# Patient Record
Sex: Female | Born: 1950 | Race: White | Hispanic: No | Marital: Married | Smoking: Never smoker
Health system: Southern US, Community
[De-identification: ages and names within clinical notes are randomized; demographics above are authoritative.]

## PROBLEM LIST (undated history)

## (undated) DIAGNOSIS — E119 Type 2 diabetes mellitus without complications: Secondary | ICD-10-CM

## (undated) DIAGNOSIS — G4730 Sleep apnea, unspecified: Secondary | ICD-10-CM

## (undated) DIAGNOSIS — Z8489 Family history of other specified conditions: Secondary | ICD-10-CM

## (undated) DIAGNOSIS — Z8709 Personal history of other diseases of the respiratory system: Secondary | ICD-10-CM

## (undated) DIAGNOSIS — M1990 Unspecified osteoarthritis, unspecified site: Secondary | ICD-10-CM

## (undated) DIAGNOSIS — I10 Essential (primary) hypertension: Secondary | ICD-10-CM

## (undated) DIAGNOSIS — Z8639 Personal history of other endocrine, nutritional and metabolic disease: Secondary | ICD-10-CM

## (undated) DIAGNOSIS — F410 Panic disorder [episodic paroxysmal anxiety] without agoraphobia: Secondary | ICD-10-CM

## (undated) DIAGNOSIS — E7800 Pure hypercholesterolemia, unspecified: Secondary | ICD-10-CM

## (undated) HISTORY — PX: CHOLECYSTECTOMY: SHX55

## (undated) HISTORY — PX: EYE SURGERY: SHX253

## (undated) HISTORY — PX: TUBAL LIGATION: SHX77

---

## 2018-01-25 ENCOUNTER — Encounter (INDEPENDENT_AMBULATORY_CARE_PROVIDER_SITE_OTHER): Payer: Self-pay | Admitting: Orthopaedic Surgery

## 2018-01-25 ENCOUNTER — Ambulatory Visit (INDEPENDENT_AMBULATORY_CARE_PROVIDER_SITE_OTHER): Payer: Medicare Other | Admitting: Orthopaedic Surgery

## 2018-01-25 VITALS — Ht 62.0 in | Wt 260.0 lb

## 2018-01-25 DIAGNOSIS — M1612 Unilateral primary osteoarthritis, left hip: Secondary | ICD-10-CM

## 2018-01-25 DIAGNOSIS — M1611 Unilateral primary osteoarthritis, right hip: Secondary | ICD-10-CM | POA: Diagnosis not present

## 2018-01-25 NOTE — Progress Notes (Signed)
Office Visit Note   Patient: Brittany Nielsen           Date of Birth: July 28, 1950           MRN: [id number] Visit Date: 01/25/2018              Requested by: No referring provider defined for this encounter. PCP: System, Pcp Not In   Assessment & Plan: Visit Diagnoses:  1. Unilateral primary osteoarthritis, left hip   2. Unilateral primary osteoarthritis, right hip     Plan: Given her anatomy and the severity of her arthritis, I am comfortable with proceeding with hip replacement surgery on her right hip.  She understands that she has a significant height and risk for wound complications and implant failure given her obesity.  There is a high risk of infection as well.  Given that, other than her BMI of being over 40, the remainder of her clinical exam is entirely normal in terms of getting to her right hip safely through a direct anterior approach.  I was actually quite surprised at this.  I do feel that if the hip is been successfully on her she could then continue to lose weight.  We had a long and thorough discussion about the surgery.  I showed her x-rays and we will look that a hip model and explained in detail what the surgery involves.  We had a discussion about the intraoperative and postoperative course and the risk and benefits involved.  All question concerns were answered and addressed.  We will work on getting her right hip scheduled for a total hip arthroplasty.  Follow-Up Instructions: Return for 2 weeks post-op.   Orders:  No orders of the defined types were placed in this encounter.  No orders of the defined types were placed in this encounter.     Procedures: No procedures performed   Clinical Data: No additional findings.   Subjective: Chief Complaint  Patient presents with  . Right Hip - Pain  . Left Hip - Pain  The patient is a very pleasant 68 year old female who comes in for evaluation treatment of bilateral hip pain with the right worse than  the left.  Is been going on for now over 3 years.  She is someone with a BMI of 47.  She is not a diabetic.  She does use a walking stick when she ambulates.  Her pain is become 10 out of 10.  It is gotten to where it is detrimentally affected her mobility, her quality of life and her activities of daily living.  She has x-rays that accompany her.  She has been told she has severe end-stage arthritis of both her hips.  She is also been told that given her BMI she needs to lose a significant of weight before anyone would attempt surgery.  However she has not had any hands on exam with someone actually examining her right hip and examining her in terms of her obesity.  She is worked as much she can on activity modification and trying to lose weight.  HPI  Review of Systems She currently denies any headache, chest pain, shortness of breath, fever, chills, nausea, vomiting  Objective: Vital Signs: Ht 5\' 2"  (1.575 m)   Wt 260 lb (117.9 kg)   BMI 47.55 kg/m   Physical Exam She is alert and oriented x3 and in no acute distress Ortho Exam On examination she is able to get up on the exam table and I  later in a supine position.  Both hips flex and extend well.  Both hips actually have good internal and external rotation but severe pain in the groin on both hips with the right worse than the left.  Her leg lengths are equal.  Given her obesity she actually has a very small thighs and small legs.  Her abdomen is easily able to be mobilized and I can get it out of the way easily to consider anterior preplacement surgery. Specialty Comments:  No specialty comments available.  Imaging: No results found.   PMFS History: Patient Active Problem List   Diagnosis Date Noted  . Unilateral primary osteoarthritis, left hip 01/25/2018  . Unilateral primary osteoarthritis, right hip 01/25/2018   History reviewed. No pertinent past medical history.  History reviewed. No pertinent family history.  History  reviewed. No pertinent surgical history. Social History   Occupational History  . Not on file  Tobacco Use  . Smoking status: Not on file  Substance and Sexual Activity  . Alcohol use: Not on file  . Drug use: Not on file  . Sexual activity: Not on file

## 2018-02-12 ENCOUNTER — Other Ambulatory Visit (INDEPENDENT_AMBULATORY_CARE_PROVIDER_SITE_OTHER): Payer: Self-pay | Admitting: Physician Assistant

## 2018-02-15 NOTE — Patient Instructions (Addendum)
Brittany Nielsen  02/15/2018   Your procedure is scheduled on: 02-26-2018   Report to [facility] Main  Entrance     Report to admitting at 7:15AM    Call this number if you have problems the morning of surgery [phone]   PLEASE BRING CPAP MASK AND  TUBING ONLY. DEVICE WILL BE PROVIDED!    Remember: Do not eat food or drink liquids :After Midnight. BRUSH YOUR TEETH MORNING OF SURGERY AND RINSE YOUR MOUTH OUT, NO CHEWING GUM CANDY OR MINTS.     Take these medicines the morning of surgery with A SIP OF WATER: NONE                                You may not have any metal on your body including hair pins and              piercings  Do not wear jewelry, make-up, lotions, powders or perfumes, deodorant             Do not wear nail polish.  Do not shave  48 hours prior to surgery.                Do not bring valuables to the [facility]. [facility] IS NOT             RESPONSIBLE   FOR VALUABLES.  Contacts, dentures or bridgework may not be worn into surgery.  Leave suitcase in the car. After surgery it may be brought to your room.                   Please read over the following fact sheets you were given: _____________________________________________________________________             [facility] Pre[facility] Before surgery, you can play an important role.  Because skin is not sterile, your skin needs to be as free of germs as possible.  You can reduce the number of germs on your skin by washing with CHG (chlorahexidine gluconate) soap before surgery.  CHG is an antiseptic cleaner which kills germs and bonds with the skin to continue killing germs even after washing. Please DO NOT use if you have an allergy to CHG or antibacterial soaps.  If your skin becomes reddened/irritated stop using the CHG and inform your nurse when you arrive at Short Stay. Do not shave (including legs and underarms) for at least 48 hours prior to the first CHG  shower.  You may shave your face/neck. Please follow these instructions carefully:  1.  Shower with CHG Soap the night before surgery and the  morning of Surgery.  2.  If you choose to wash your hair, wash your hair first as usual with your  normal  shampoo.  3.  After you shampoo, rinse your hair and body thoroughly to remove the  shampoo.                           4.  Use CHG as you would any other liquid soap.  You can apply chg directly  to the skin and wash                       Gently with a scrungie or clean washcloth.  5.  Apply the CHG Soap to your body ONLY FROM THE NECK DOWN.   Do not use on face/ open                           Wound or open sores. Avoid contact with eyes, ears mouth and genitals (private parts).                       Wash face,  Genitals (private parts) with your normal soap.             6.  Wash thoroughly, paying special attention to the area where your surgery  will be performed.  7.  Thoroughly rinse your body with warm water from the neck down.  8.  DO NOT shower/wash with your normal soap after using and rinsing off  the CHG Soap.                9.  Pat yourself dry with a clean towel.            10.  Wear clean pajamas.            11.  Place clean sheets on your bed the night of your first shower and do not  sleep with pets. Day of Surgery : Do not apply any lotions/deodorants the morning of surgery.  Please wear clean clothes to the [facility]/surgery center.  FAILURE TO FOLLOW THESE INSTRUCTIONS MAY RESULT IN THE CANCELLATION OF YOUR SURGERY PATIENT SIGNATURE_________________________________  NURSE SIGNATURE__________________________________  ________________________________________________________________________   [name]  An incentive spirometer is a tool that can help keep your lungs clear and active. This tool measures how well you are filling your lungs with each breath. Taking long deep breaths may help reverse or decrease the chance  of developing breathing (pulmonary) problems (especially infection) following:  A long period of time when you are unable to move or be active. BEFORE THE PROCEDURE   If the spirometer includes an indicator to show your best effort, your nurse or respiratory therapist will set it to a desired goal.  If possible, sit up straight or lean slightly forward. Try not to slouch.  Hold the incentive spirometer in an upright position. INSTRUCTIONS FOR USE  1. Sit on the edge of your bed if possible, or sit up as far as you can in bed or on a chair. 2. Hold the incentive spirometer in an upright position. 3. Breathe out normally. 4. Place the mouthpiece in your mouth and seal your lips tightly around it. 5. Breathe in slowly and as deeply as possible, raising the piston or the ball toward the top of the column. 6. Hold your breath for 3-5 seconds or for as long as possible. Allow the piston or ball to fall to the bottom of the column. 7. Remove the mouthpiece from your mouth and breathe out normally. 8. Rest for a few seconds and repeat Steps 1 through 7 at least 10 times every 1-2 hours when you are awake. Take your time and take a few normal breaths between deep breaths. 9. The spirometer may include an indicator to show your best effort. Use the indicator as a goal to work toward during each repetition. 10. After each set of 10 deep breaths, practice coughing to be sure your lungs are clear. If you have an incision (the cut made at the time of surgery), support your incision when coughing by placing a pillow or  rolled up towels firmly against it. Once you are able to get out of bed, walk around indoors and cough well. You may stop using the incentive spirometer when instructed by your caregiver.  RISKS AND COMPLICATIONS  Take your time so you do not get dizzy or light-headed.  If you are in pain, you may need to take or ask for pain medication before doing incentive spirometry. It is harder to  take a deep breath if you are having pain. AFTER USE  Rest and breathe slowly and easily.  It can be helpful to keep track of a log of your progress. Your caregiver can provide you with a simple table to help with this. If you are using the spirometer at home, follow these instructions: [place] IF:   You are having difficultly using the spirometer.  You have trouble using the spirometer as often as instructed.  Your pain medication is not giving enough relief while using the spirometer.  You develop fever of 100.5 F (38.1 C) or higher. SEEK IMMEDIATE MEDICAL CARE IF:   You cough up bloody sputum that had not been present before.  You develop fever of 102 F (38.9 C) or greater.  You develop worsening pain at or near the incision site. MAKE SURE YOU:   Understand these instructions.  Will watch your condition.  Will get help right away if you are not doing well or get worse. Document Released: 05/12/2006 Document Revised: 03/24/2011 Document Reviewed: 07/13/2006 [facility] Patient Information 2014 [place], [place].   ________________________________________________________________________

## 2018-02-16 ENCOUNTER — Encounter (HOSPITAL_COMMUNITY)
Admission: RE | Admit: 2018-02-16 | Discharge: 2018-02-16 | Disposition: A | Discharge status: Home / Self Care | Payer: Medicare Other | Source: Ambulatory Visit | Attending: Orthopaedic Surgery | Admitting: Orthopaedic Surgery

## 2018-02-16 ENCOUNTER — Other Ambulatory Visit: Payer: Self-pay

## 2018-02-16 ENCOUNTER — Encounter (HOSPITAL_COMMUNITY): Payer: Self-pay | Admitting: Emergency Medicine

## 2018-02-16 DIAGNOSIS — Z01818 Encounter for other preprocedural examination: Secondary | ICD-10-CM | POA: Insufficient documentation

## 2018-02-16 DIAGNOSIS — R000 Tachycardia, unspecified: Secondary | ICD-10-CM | POA: Insufficient documentation

## 2018-02-16 DIAGNOSIS — M1611 Unilateral primary osteoarthritis, right hip: Secondary | ICD-10-CM | POA: Diagnosis not present

## 2018-02-16 DIAGNOSIS — R9431 Abnormal electrocardiogram [ECG] [EKG]: Secondary | ICD-10-CM | POA: Insufficient documentation

## 2018-02-16 DIAGNOSIS — I10 Essential (primary) hypertension: Secondary | ICD-10-CM | POA: Diagnosis not present

## 2018-02-16 HISTORY — DX: Personal history of other endocrine, nutritional and metabolic disease: Z86.39

## 2018-02-16 HISTORY — DX: Essential (primary) hypertension: I10

## 2018-02-16 HISTORY — DX: Personal history of other diseases of the respiratory system: Z87.09

## 2018-02-16 HISTORY — DX: Sleep apnea, unspecified: G47.30

## 2018-02-16 HISTORY — DX: Pure hypercholesterolemia, unspecified: E78.00

## 2018-02-16 HISTORY — DX: Panic disorder (episodic paroxysmal anxiety): F41.0

## 2018-02-16 HISTORY — DX: Unspecified osteoarthritis, unspecified site: M19.90

## 2018-02-16 HISTORY — DX: Family history of other specified conditions: Z84.89

## 2018-02-16 LAB — CBC
HCT: 46.5 % — ABNORMAL HIGH (ref 36.0–46.0)
HEMOGLOBIN: 14.8 g/dL (ref 12.0–15.0)
MCH: 27.5 pg (ref 26.0–34.0)
MCHC: 31.8 g/dL (ref 30.0–36.0)
MCV: 86.3 fL (ref 80.0–100.0)
Platelets: 339 10*3/uL (ref 150–400)
RBC: 5.39 MIL/uL — ABNORMAL HIGH (ref 3.87–5.11)
RDW: 13.4 % (ref 11.5–15.5)
WBC: 13.5 10*3/uL — ABNORMAL HIGH (ref 4.0–10.5)
nRBC: 0 % (ref 0.0–0.2)

## 2018-02-16 LAB — BASIC METABOLIC PANEL
Anion gap: 11 (ref 5–15)
BUN: 18 mg/dL (ref 8–23)
CO2: 25 mmol/L (ref 22–32)
Calcium: 9.2 mg/dL (ref 8.9–10.3)
Chloride: 100 mmol/L (ref 98–111)
Creatinine, Ser: 1.01 mg/dL — ABNORMAL HIGH (ref 0.44–1.00)
GFR calc Af Amer: >60 mL/min (ref >60)
GFR, EST NON AFRICAN AMERICAN: 58 mL/min — AB (ref >60)
Glucose, Bld: 133 mg/dL — ABNORMAL HIGH (ref 70–99)
POTASSIUM: 3.5 mmol/L (ref 3.5–5.1)
Sodium: 136 mmol/L (ref 135–145)

## 2018-02-16 LAB — SURGICAL PCR SCREEN
MRSA, PCR: NEGATIVE
Staphylococcus aureus: POSITIVE — AB

## 2018-02-26 ENCOUNTER — Ambulatory Visit (HOSPITAL_COMMUNITY): Payer: Medicare Other

## 2018-02-26 ENCOUNTER — Encounter (HOSPITAL_COMMUNITY)
Admission: RE | Disposition: A | Discharge status: Home Health | Payer: Self-pay | Source: Home / Self Care | Attending: Orthopaedic Surgery

## 2018-02-26 ENCOUNTER — Encounter (HOSPITAL_COMMUNITY): Payer: Self-pay

## 2018-02-26 ENCOUNTER — Ambulatory Visit (HOSPITAL_COMMUNITY): Payer: Medicare Other | Admitting: Registered Nurse

## 2018-02-26 ENCOUNTER — Observation Stay (HOSPITAL_COMMUNITY): Payer: Medicare Other

## 2018-02-26 ENCOUNTER — Inpatient Hospital Stay (HOSPITAL_COMMUNITY)
Admission: RE | Admit: 2018-02-26 | Discharge: 2018-02-28 | DRG: 470 | Disposition: A | Discharge status: Home Health | Payer: Medicare Other | Attending: Orthopaedic Surgery | Admitting: Orthopaedic Surgery

## 2018-02-26 ENCOUNTER — Ambulatory Visit (HOSPITAL_COMMUNITY): Payer: Medicare Other | Admitting: Physician Assistant

## 2018-02-26 ENCOUNTER — Other Ambulatory Visit: Payer: Self-pay

## 2018-02-26 DIAGNOSIS — M1611 Unilateral primary osteoarthritis, right hip: Secondary | ICD-10-CM | POA: Diagnosis not present

## 2018-02-26 DIAGNOSIS — Z6841 Body Mass Index (BMI) 40.0 and over, adult: Secondary | ICD-10-CM

## 2018-02-26 DIAGNOSIS — M1612 Unilateral primary osteoarthritis, left hip: Secondary | ICD-10-CM | POA: Diagnosis present

## 2018-02-26 DIAGNOSIS — G4730 Sleep apnea, unspecified: Secondary | ICD-10-CM | POA: Diagnosis present

## 2018-02-26 DIAGNOSIS — E7800 Pure hypercholesterolemia, unspecified: Secondary | ICD-10-CM | POA: Diagnosis present

## 2018-02-26 DIAGNOSIS — Z419 Encounter for procedure for purposes other than remedying health state, unspecified: Secondary | ICD-10-CM

## 2018-02-26 DIAGNOSIS — I10 Essential (primary) hypertension: Secondary | ICD-10-CM | POA: Diagnosis present

## 2018-02-26 DIAGNOSIS — Z96641 Presence of right artificial hip joint: Secondary | ICD-10-CM

## 2018-02-26 HISTORY — PX: TOTAL HIP ARTHROPLASTY: SHX124

## 2018-02-26 HISTORY — PX: JOINT REPLACEMENT: SHX530

## 2018-02-26 SURGERY — ARTHROPLASTY, HIP, TOTAL, ANTERIOR APPROACH
Anesthesia: Spinal | Site: Hip | Laterality: Right

## 2018-02-26 MED ORDER — MENTHOL 3 MG MT LOZG: 1.0000 | LOZENGE | OROMUCOSAL | Status: DC | PRN

## 2018-02-26 MED ORDER — DOCUSATE SODIUM 100 MG PO CAPS
100.0000 mg | ORAL_CAPSULE | Freq: Two times a day (BID) | ORAL | Status: DC
  Administered 2018-02-26 – 2018-02-28 (×4): 100 mg via ORAL
  Filled 2018-02-26 (×4): qty 1

## 2018-02-26 MED ORDER — ALUM & MAG HYDROXIDE-SIMETH 200-200-20 MG/5ML PO SUSP: 30.0000 mL | ORAL | Status: DC | PRN

## 2018-02-26 MED ORDER — PHENYLEPHRINE 40 MCG/ML (10ML) SYRINGE FOR IV PUSH (FOR BLOOD PRESSURE SUPPORT)
PREFILLED_SYRINGE | INTRAVENOUS | Status: AC
  Filled 2018-02-26: qty 10

## 2018-02-26 MED ORDER — ONDANSETRON HCL 4 MG/2ML IJ SOLN
4.0000 mg | Freq: Four times a day (QID) | INTRAMUSCULAR | Status: DC | PRN
  Administered 2018-02-26 – 2018-02-27 (×2): 4 mg via INTRAVENOUS
  Filled 2018-02-26 (×2): qty 2

## 2018-02-26 MED ORDER — LIDOCAINE 2% (20 MG/ML) 5 ML SYRINGE
INTRAMUSCULAR | Status: AC
  Filled 2018-02-26: qty 5

## 2018-02-26 MED ORDER — SODIUM CHLORIDE 0.9 % IV SOLN
INTRAVENOUS | Status: DC | PRN
  Administered 2018-02-26: 40 ug/min via INTRAVENOUS

## 2018-02-26 MED ORDER — PHENYLEPHRINE 40 MCG/ML (10ML) SYRINGE FOR IV PUSH (FOR BLOOD PRESSURE SUPPORT)
PREFILLED_SYRINGE | INTRAVENOUS | Status: DC | PRN
  Administered 2018-02-26 (×2): 80 ug via INTRAVENOUS

## 2018-02-26 MED ORDER — MIDAZOLAM HCL 5 MG/5ML IJ SOLN
INTRAMUSCULAR | Status: DC | PRN
  Administered 2018-02-26 (×2): 1 mg via INTRAVENOUS

## 2018-02-26 MED ORDER — DEXAMETHASONE SODIUM PHOSPHATE 10 MG/ML IJ SOLN
INTRAMUSCULAR | Status: DC | PRN
  Administered 2018-02-26: 10 mg via INTRAVENOUS

## 2018-02-26 MED ORDER — METHOCARBAMOL 500 MG PO TABS
500.0000 mg | ORAL_TABLET | Freq: Four times a day (QID) | ORAL | Status: DC | PRN
  Administered 2018-02-27: 500 mg via ORAL
  Filled 2018-02-26: qty 1

## 2018-02-26 MED ORDER — HYDROMORPHONE HCL 1 MG/ML IJ SOLN
0.5000 mg | INTRAMUSCULAR | Status: DC | PRN
  Administered 2018-02-26 – 2018-02-27 (×4): 1 mg via INTRAVENOUS
  Filled 2018-02-26 (×5): qty 1

## 2018-02-26 MED ORDER — PROPOFOL 500 MG/50ML IV EMUL
INTRAVENOUS | Status: DC | PRN
  Administered 2018-02-26: 50 ug/kg/min via INTRAVENOUS

## 2018-02-26 MED ORDER — DEXAMETHASONE SODIUM PHOSPHATE 10 MG/ML IJ SOLN
INTRAMUSCULAR | Status: AC
  Filled 2018-02-26: qty 1

## 2018-02-26 MED ORDER — ACETAMINOPHEN 325 MG PO TABS
325.0000 mg | ORAL_TABLET | Freq: Four times a day (QID) | ORAL | Status: DC | PRN
  Administered 2018-02-27: 650 mg via ORAL
  Filled 2018-02-26: qty 2

## 2018-02-26 MED ORDER — CEFAZOLIN SODIUM-DEXTROSE 2-4 GM/100ML-% IV SOLN
2.0000 g | INTRAVENOUS | Status: AC
  Administered 2018-02-26: 2 g via INTRAVENOUS
  Filled 2018-02-26: qty 100

## 2018-02-26 MED ORDER — AMLODIPINE BESYLATE 10 MG PO TABS
10.0000 mg | ORAL_TABLET | Freq: Every day | ORAL | Status: DC
  Administered 2018-02-26 – 2018-02-27 (×2): 10 mg via ORAL
  Filled 2018-02-26 (×2): qty 1

## 2018-02-26 MED ORDER — METOCLOPRAMIDE HCL 5 MG PO TABS: 5.0000 mg | ORAL_TABLET | Freq: Three times a day (TID) | ORAL | Status: DC | PRN

## 2018-02-26 MED ORDER — ONDANSETRON HCL 4 MG PO TABS: 4.0000 mg | ORAL_TABLET | Freq: Four times a day (QID) | ORAL | Status: DC | PRN

## 2018-02-26 MED ORDER — PROMETHAZINE HCL 25 MG/ML IJ SOLN: 6.2500 mg | INTRAMUSCULAR | Status: DC | PRN

## 2018-02-26 MED ORDER — PROPOFOL 10 MG/ML IV BOLUS
INTRAVENOUS | Status: AC
  Filled 2018-02-26: qty 60

## 2018-02-26 MED ORDER — FENTANYL CITRATE (PF) 100 MCG/2ML IJ SOLN
INTRAMUSCULAR | Status: DC | PRN
  Administered 2018-02-26 (×2): 50 ug via INTRAVENOUS

## 2018-02-26 MED ORDER — CEFAZOLIN SODIUM-DEXTROSE 1-4 GM/50ML-% IV SOLN
1.0000 g | Freq: Four times a day (QID) | INTRAVENOUS | Status: AC
  Administered 2018-02-26 (×2): 1 g via INTRAVENOUS
  Filled 2018-02-26 (×2): qty 50

## 2018-02-26 MED ORDER — POLYETHYLENE GLYCOL 3350 17 G PO PACK: 17.0000 g | PACK | Freq: Every day | ORAL | Status: DC | PRN

## 2018-02-26 MED ORDER — LACTATED RINGERS IV SOLN
INTRAVENOUS | Status: DC
  Administered 2018-02-26: 08:00:00 via INTRAVENOUS

## 2018-02-26 MED ORDER — FENTANYL CITRATE (PF) 100 MCG/2ML IJ SOLN
INTRAMUSCULAR | Status: AC
  Filled 2018-02-26: qty 2

## 2018-02-26 MED ORDER — SODIUM CHLORIDE 0.9 % IV SOLN
INTRAVENOUS | Status: DC
  Administered 2018-02-26: 13:00:00 via INTRAVENOUS

## 2018-02-26 MED ORDER — BISACODYL 10 MG RE SUPP: 10.0000 mg | Freq: Every day | RECTAL | Status: DC | PRN

## 2018-02-26 MED ORDER — PANTOPRAZOLE SODIUM 40 MG PO TBEC
40.0000 mg | DELAYED_RELEASE_TABLET | Freq: Every day | ORAL | Status: DC
  Administered 2018-02-26 – 2018-02-28 (×3): 40 mg via ORAL
  Filled 2018-02-26 (×3): qty 1

## 2018-02-26 MED ORDER — OXYCODONE HCL 5 MG PO TABS
10.0000 mg | ORAL_TABLET | ORAL | Status: DC | PRN
  Administered 2018-02-28: 10 mg via ORAL
  Filled 2018-02-26 (×2): qty 2

## 2018-02-26 MED ORDER — SODIUM CHLORIDE 0.9 % IR SOLN
Status: DC | PRN
  Administered 2018-02-26 (×2): 1000 mL

## 2018-02-26 MED ORDER — HYDROMORPHONE HCL 1 MG/ML IJ SOLN: 0.2500 mg | INTRAMUSCULAR | Status: DC | PRN

## 2018-02-26 MED ORDER — ROSUVASTATIN CALCIUM 10 MG PO TABS
10.0000 mg | ORAL_TABLET | Freq: Every day | ORAL | Status: DC
  Administered 2018-02-26 – 2018-02-27 (×2): 10 mg via ORAL
  Filled 2018-02-26 (×2): qty 1

## 2018-02-26 MED ORDER — OXYCODONE HCL 5 MG PO TABS
5.0000 mg | ORAL_TABLET | ORAL | Status: DC | PRN
  Administered 2018-02-26 – 2018-02-28 (×4): 10 mg via ORAL
  Filled 2018-02-26 (×4): qty 2

## 2018-02-26 MED ORDER — ONDANSETRON HCL 4 MG/2ML IJ SOLN
INTRAMUSCULAR | Status: DC | PRN
  Administered 2018-02-26: 4 mg via INTRAVENOUS

## 2018-02-26 MED ORDER — METOCLOPRAMIDE HCL 5 MG/ML IJ SOLN
5.0000 mg | Freq: Three times a day (TID) | INTRAMUSCULAR | Status: DC | PRN
  Administered 2018-02-26: 10 mg via INTRAVENOUS
  Filled 2018-02-26: qty 2

## 2018-02-26 MED ORDER — DIPHENHYDRAMINE HCL 12.5 MG/5ML PO ELIX: 12.5000 mg | ORAL_SOLUTION | ORAL | Status: DC | PRN

## 2018-02-26 MED ORDER — ONDANSETRON HCL 4 MG/2ML IJ SOLN
INTRAMUSCULAR | Status: AC
  Filled 2018-02-26: qty 2

## 2018-02-26 MED ORDER — HYDROCHLOROTHIAZIDE 25 MG PO TABS
25.0000 mg | ORAL_TABLET | Freq: Every day | ORAL | Status: DC
  Administered 2018-02-26 – 2018-02-27 (×2): 25 mg via ORAL
  Filled 2018-02-26 (×2): qty 1

## 2018-02-26 MED ORDER — MIDAZOLAM HCL 2 MG/2ML IJ SOLN
INTRAMUSCULAR | Status: AC
  Filled 2018-02-26: qty 2

## 2018-02-26 MED ORDER — POTASSIUM CHLORIDE CRYS ER 20 MEQ PO TBCR
20.0000 meq | EXTENDED_RELEASE_TABLET | Freq: Two times a day (BID) | ORAL | Status: DC
  Administered 2018-02-26 – 2018-02-28 (×5): 20 meq via ORAL
  Filled 2018-02-26 (×5): qty 1

## 2018-02-26 MED ORDER — KETOROLAC TROMETHAMINE 30 MG/ML IJ SOLN: 30.0000 mg | Freq: Once | INTRAMUSCULAR | Status: DC | PRN

## 2018-02-26 MED ORDER — LISINOPRIL 20 MG PO TABS
40.0000 mg | ORAL_TABLET | Freq: Every day | ORAL | Status: DC
  Administered 2018-02-26 – 2018-02-27 (×2): 40 mg via ORAL
  Filled 2018-02-26 (×2): qty 2

## 2018-02-26 MED ORDER — PHENOL 1.4 % MT LIQD: 1.0000 | OROMUCOSAL | Status: DC | PRN

## 2018-02-26 MED ORDER — ASPIRIN 81 MG PO CHEW
81.0000 mg | CHEWABLE_TABLET | Freq: Two times a day (BID) | ORAL | Status: DC
  Administered 2018-02-26 – 2018-02-28 (×4): 81 mg via ORAL
  Filled 2018-02-26 (×4): qty 1

## 2018-02-26 MED ORDER — MEPERIDINE HCL 50 MG/ML IJ SOLN: 6.2500 mg | INTRAMUSCULAR | Status: DC | PRN

## 2018-02-26 MED ORDER — CHLORHEXIDINE GLUCONATE 4 % EX LIQD: 60.0000 mL | Freq: Once | CUTANEOUS | Status: DC

## 2018-02-26 MED ORDER — LISINOPRIL-HYDROCHLOROTHIAZIDE 20-12.5 MG PO TABS: 2.0000 | ORAL_TABLET | Freq: Every day | ORAL | Status: DC

## 2018-02-26 MED ORDER — PAROXETINE HCL 20 MG PO TABS
40.0000 mg | ORAL_TABLET | Freq: Every day | ORAL | Status: DC
  Administered 2018-02-26 – 2018-02-27 (×2): 40 mg via ORAL
  Filled 2018-02-26 (×2): qty 2

## 2018-02-26 MED ORDER — LIDOCAINE 2% (20 MG/ML) 5 ML SYRINGE
INTRAMUSCULAR | Status: DC | PRN
  Administered 2018-02-26: 60 mg via INTRAVENOUS

## 2018-02-26 MED ORDER — TRANEXAMIC ACID-NACL 1000-0.7 MG/100ML-% IV SOLN
1000.0000 mg | INTRAVENOUS | Status: AC
  Administered 2018-02-26: 1000 mg via INTRAVENOUS
  Filled 2018-02-26: qty 100

## 2018-02-26 MED ORDER — BUPIVACAINE IN DEXTROSE 0.75-8.25 % IT SOLN
INTRATHECAL | Status: DC | PRN
  Administered 2018-02-26: 1.8 mL via INTRATHECAL

## 2018-02-26 MED ORDER — PROPOFOL 10 MG/ML IV BOLUS
INTRAVENOUS | Status: AC
  Filled 2018-02-26: qty 20

## 2018-02-26 MED ORDER — PHENYLEPHRINE HCL 10 MG/ML IJ SOLN
INTRAMUSCULAR | Status: AC
  Filled 2018-02-26: qty 1

## 2018-02-26 MED ORDER — METHOCARBAMOL 500 MG IVPB - SIMPLE MED
500.0000 mg | Freq: Four times a day (QID) | INTRAVENOUS | Status: DC | PRN
  Filled 2018-02-26: qty 50

## 2018-02-26 SURGICAL SUPPLY — 41 items
BAG ZIPLOCK 12X15 (MISCELLANEOUS) IMPLANT
BENZOIN TINCTURE PRP APPL 2/3 (GAUZE/BANDAGES/DRESSINGS) ×3 IMPLANT
BLADE SAW SGTL 18X1.27X75 (BLADE) ×2 IMPLANT
BLADE SAW SGTL 18X1.27X75MM (BLADE) ×1
BLADE SURG SZ10 CARB STEEL (BLADE) ×6 IMPLANT
CLOSURE WOUND 1/2 X4 (GAUZE/BANDAGES/DRESSINGS) ×1
COVER PERINEAL POST (MISCELLANEOUS) ×3 IMPLANT
COVER SURGICAL LIGHT HANDLE (MISCELLANEOUS) ×3 IMPLANT
COVER WAND RF STERILE (DRAPES) IMPLANT
DRAPE STERI IOBAN 125X83 (DRAPES) ×3 IMPLANT
DRAPE U-SHAPE 47X51 STRL (DRAPES) ×6 IMPLANT
DRSG AQUACEL AG ADV 3.5X10 (GAUZE/BANDAGES/DRESSINGS) ×3 IMPLANT
DURAPREP 26ML APPLICATOR (WOUND CARE) ×3 IMPLANT
ELECT REM PT RETURN 15FT ADLT (MISCELLANEOUS) ×3 IMPLANT
GAUZE XEROFORM 1X8 LF (GAUZE/BANDAGES/DRESSINGS) ×3 IMPLANT
GLOVE BIO SURGEON STRL SZ7.5 (GLOVE) ×3 IMPLANT
GLOVE BIOGEL PI IND STRL 8 (GLOVE) ×2 IMPLANT
GLOVE BIOGEL PI INDICATOR 8 (GLOVE) ×4
GLOVE ECLIPSE 8.0 STRL XLNG CF (GLOVE) ×3 IMPLANT
GOWN STRL REUS W/TWL XL LVL3 (GOWN DISPOSABLE) ×6 IMPLANT
HANDPIECE INTERPULSE COAX TIP (DISPOSABLE) ×2
HEAD M SROM 36MM 2 (Hips) ×1 IMPLANT
HOLDER FOLEY CATH W/STRAP (MISCELLANEOUS) ×3 IMPLANT
LINER ACETAB NEUTRAL 36ID 520D (Liner) ×3 IMPLANT
PACK ANTERIOR HIP CUSTOM (KITS) ×3 IMPLANT
PIN SECTOR W/GRIP ACE CUP 52MM (Hips) ×3 IMPLANT
SET HNDPC FAN SPRY TIP SCT (DISPOSABLE) ×1 IMPLANT
SROM M HEAD 36MM 2 (Hips) ×3 IMPLANT
STAPLER VISISTAT 35W (STAPLE) IMPLANT
STEM CORAIL KA10 (Stem) ×3 IMPLANT
STRIP CLOSURE SKIN 1/2X4 (GAUZE/BANDAGES/DRESSINGS) ×2 IMPLANT
SUT ETHIBOND NAB CT1 #1 30IN (SUTURE) ×3 IMPLANT
SUT ETHILON 2 0 PS N (SUTURE) ×9 IMPLANT
SUT MNCRL AB 4-0 PS2 18 (SUTURE) IMPLANT
SUT VIC AB 0 CT1 36 (SUTURE) ×3 IMPLANT
SUT VIC AB 1 CT1 36 (SUTURE) ×3 IMPLANT
SUT VIC AB 2-0 CT1 27 (SUTURE) ×4
SUT VIC AB 2-0 CT1 TAPERPNT 27 (SUTURE) ×2 IMPLANT
TRAY FOLEY CATH 14FRSI W/METER (CATHETERS) ×3 IMPLANT
TRAY FOLEY MTR SLVR 16FR STAT (SET/KITS/TRAYS/PACK) IMPLANT
YANKAUER SUCT BULB TIP 10FT TU (MISCELLANEOUS) ×3 IMPLANT

## 2018-02-26 NOTE — Op Note (Signed)
Brittany, Nielsen MEDICAL RECORD WH:[id number] ACCOUNT [id number] DATE OF BIRTH:[birth year] FACILITY: [facility] LOCATION: [facility] PHYSICIAN:Tiron Suski Aretha Parrot, MD  OPERATIVE REPORT  DATE OF PROCEDURE:  02/26/2018  PREOPERATIVE DIAGNOSIS:  Primary osteoarthritis and degenerative joint disease, right hip.  POSTOPERATIVE DIAGNOSIS:  Primary osteoarthritis and degenerative joint disease, right hip.  PROCEDURE:  Right total hip arthroplasty, direct anterior approach.  IMPLANTS:  DePuy Sector Gription acetabular component size 52, size 36+0 neutral polyethylene liner, size 10 Corail femoral component with standard offset, size 36 -2 metal hip ball.  SURGEON:  Vanita Panda. Magnus Ivan, MD  ASSISTANT:  Richardean Canal, PA-C.  ANESTHESIA:  Spinal.  ANTIBIOTICS:  Two grams IV Ancef.  ESTIMATED BLOOD LOSS:  200 mL  COMPLICATIONS:  None.  INDICATIONS:  The patient is a very pleasant 68 year old female with debilitating arthritis involving her right hip.  She is a morbidly obese individual and she does feel like her hip pain and arthrosis has kept her from being as active to be able to  lose weight.  When I saw her in the office, I did examine her and felt comfortable proceeding with anterior hip surgery getting the guarantee from her that she would still proceed with her activity modification and her caloric intake in terms of  modifying her nutritional status.  She has already lost a lot of weight.  At this point, her hip pain is daily and has detrimentally affected activities of daily living, her mobility and her quality of life.  She does wish to proceed with a total hip  arthroplasty.  She understands this is certainly a difficult procedure given her morbid obesity and her risks of surgery are certainly elevated including the risk of acute blood loss anemia, nerve or vessel injury, fracture, infection, DVT, dislocation,  implant failure and leg length discrepancy.  She understands  her goals are to decrease pain, improve mobility and overall improve quality of life.  DESCRIPTION OF PROCEDURE:  After informed consent was obtained and appropriate right hip was marked, she was brought to the operating room and placed supine on the operating table.  She was then sat up where spinal anesthesia was obtained.  She was then  laid supine again.  A perineal post was placed.  Both legs were in line skeletal traction as well.  Her right operative hip was prepped and draped with DuraPrep and sterile drapes.  A time-out called and she patient, correct right hip.  We then made an  incision just inferior and posterior to the anterior superior iliac spine and carried this obliquely down the leg.  We dissected down tensor fascia lata muscle.  Tensor fascia was then divided longitudinally to proceed with direct anterior approach to  the hip.  We identified and cauterized circumflex vessels.  I then identified the hip capsule, opened the hip capsule in an L-type format, finding moderate joint effusion and significant periarticular osteophytes around the femoral head and neck.  We  placed Cobra retractors around the medial and lateral femoral neck and made our femoral neck cut with an oscillating saw proximal to the lesser trochanter and completed this with an osteotome.  We placed a corkscrew guide in the femoral head and removed  the femoral head in its entirety and found a wide area devoid of cartilage.  We then placed a bent Hohmann over the medial acetabular rim and removed remnants of the acetabular labrum and other debris.  We began reaming under direct visualization from a  size 44 reamer in  stepwise increments going up to a size 51 with all reamers under direct visualization, the last reamer under direct fluoroscopy, so we could obtain our depth of reaming, our inclination and anteversion.  We then placed the real DePuy  Sector Gription acetabular component size 52 and a 36+0 neutral polyethylene  liner for that size acetabular component.  Attention was then turned to the femur.  With the leg externally rotated to 120 degrees, extended and adducted, we are to place a  Mueller retractor medially and Hohman retractor behind the greater trochanter, released lateral joint capsule and used a box-cutting osteotome to enter the femoral canal and a rongeur to lateralize then began broaching from a size 8 broach using Corail  broaching system going up to a size 10.  Surprisingly she has very tight type A bone which we were pleased at her morbid obesity and her age of 39.  We placed the trial size 10 stem and a standard neck and a trial 36 -2 metal hip ball.  We brought the  leg back over and up and with traction, internal rotation, reducing the pelvis and we felt like we had managed offset and leg lengths well.  She is certainly tight and we want her to be that way given her obesity as well.  We were good with rotation as  well as stability on exam.  We then dislocated the hip and removed the trial components.  We placed the real Corail femoral component with standard offset size 10 down the right femoral canal.  Then we placed the real 36 -2 metal hip ball and again  reduced this in the acetabulum.  We were pleased with stability, which was most important to [place].  We then irrigated the soft tissue with normal saline solution using pulsatile lavage.  We closed the joint capsule with interrupted #1 Ethibond suture,  followed by running 0 Vicryl and tensor fascia, 0 Vicryl in the deep tissue, 2-0 Vicryl subcutaneous tissue.  Interrupted 2-0 nylon was used to close the skin given her obesity.  Xeroform well-padded sterile dressing was applied.  She was then taken off  the operating table to the recovery room in stable condition.  All final counts were correct.  There were no complications noted.  Of note, Rexene Edison, PA-C, assisted the entire case.  His assistance was crucial for facilitating all aspects of this  case.  TN/NUANCE  D:02/26/2018 T:02/26/2018 JOB:[id number]

## 2018-02-26 NOTE — Anesthesia Procedure Notes (Signed)
Date/Time: 02/26/2018 9:50 AM Performed by: Jhonnie Garner, CRNA Oxygen Delivery Method: Simple face mask

## 2018-02-26 NOTE — Anesthesia Preprocedure Evaluation (Addendum)
Anesthesia Evaluation  Patient identified by MRN, date of birth, ID band Patient awake    Reviewed: Allergy & Precautions, NPO status , Patient's Chart, lab work & pertinent test results  Airway Mallampati: II       Dental no notable dental hx. (+) Teeth Intact   Pulmonary sleep apnea ,    Pulmonary exam normal breath sounds clear to auscultation       Cardiovascular hypertension, Pt. on medications Normal cardiovascular exam Rhythm:Regular Rate:Normal     Neuro/Psych PSYCHIATRIC DISORDERS Anxiety    GI/Hepatic negative GI ROS, Neg liver ROS,   Endo/Other  Morbid obesity  Renal/GU negative Renal ROS  negative genitourinary   Musculoskeletal  (+) Arthritis , Osteoarthritis,    Abdominal (+) + obese,   Peds  Hematology negative hematology ROS (+)   Anesthesia Other Findings   Reproductive/Obstetrics                            Anesthesia Physical Anesthesia Plan  ASA: III  Anesthesia Plan: Spinal   Post-op Pain Management:    Induction:   PONV Risk Score and Plan: 3 and Ondansetron, Dexamethasone and Midazolam  Airway Management Planned: Natural Airway, Simple Face Mask and Nasal Cannula  Additional Equipment:   Intra-op Plan:   Post-operative Plan:   Informed Consent: I have reviewed the patients History and Physical, chart, labs and discussed the procedure including the risks, benefits and alternatives for the proposed anesthesia with the patient or authorized representative who has indicated his/her understanding and acceptance.       Plan Discussed with: CRNA  Anesthesia Plan Comments:         Anesthesia Quick Evaluation

## 2018-02-26 NOTE — Transfer of Care (Signed)
Immediate Anesthesia Transfer of Care Note  Patient: Brittany Nielsen  Procedure(s) Performed: RIGHT TOTAL HIP ARTHROPLASTY ANTERIOR APPROACH (Right Hip)  Patient Location: PACU  Anesthesia Type:Spinal  Level of Consciousness: sedated  Airway & Oxygen Therapy: Patient Spontanous Breathing and Patient connected to face mask oxygen  Post-op Assessment: Report given to RN and Post -op Vital signs reviewed and stable  Post vital signs: Reviewed and stable  Last Vitals:  Vitals Value Taken Time  BP    Temp    Pulse 90 02/26/2018 11:43 AM  Resp 24 02/26/2018 11:43 AM  SpO2 95 % 02/26/2018 11:43 AM  Vitals shown include unvalidated device data.  Last Pain:  Vitals:   02/26/18 0733  TempSrc: Oral         Complications: No apparent anesthesia complications

## 2018-02-26 NOTE — Anesthesia Procedure Notes (Signed)
Spinal  Patient location during procedure: OR Start time: 02/26/2018 9:46 AM End time: 02/26/2018 9:49 AM Staffing Anesthesiologist: Leilani Able, MD Performed: anesthesiologist  Preanesthetic Checklist Completed: patient identified, site marked, surgical consent, pre-op evaluation, timeout performed, IV checked, risks and benefits discussed and monitors and equipment checked Spinal Block Patient position: sitting Prep: site prepped and draped and DuraPrep Patient monitoring: continuous pulse ox and blood pressure Approach: midline Location: L3-4 Injection technique: single-shot Needle Needle type: Pencan  Needle gauge: 24 G Needle length: 10 cm Needle insertion depth: 6 cm Assessment Sensory level: T8

## 2018-02-26 NOTE — Brief Op Note (Signed)
02/26/2018  11:09 AM  PATIENT:  Latosha Juliane Lack  68 y.o. female  PRE-OPERATIVE DIAGNOSIS:  osteoarthritis right hip  POST-OPERATIVE DIAGNOSIS:  osteoarthritis right hip  PROCEDURE:  Procedure(s): RIGHT TOTAL HIP ARTHROPLASTY ANTERIOR APPROACH (Right)  SURGEON:  Surgeon(s) and Role:    Kathryne Hitch, MD - Primary  PHYSICIAN ASSISTANT: Rexene Edison, PA-C  ANESTHESIA:   spinal  EBL:  300 mL   COUNTS:  YES  TOURNIQUET:  * No tourniquets in log *  DICTATION: .Other Dictation: Dictation Number [id number]  PLAN OF CARE: Admit to inpatient   PATIENT DISPOSITION:  PACU - hemodynamically stable.   Delay start of Pharmacological VTE agent (>24hrs) due to surgical blood loss or risk of bleeding: no

## 2018-02-26 NOTE — Evaluation (Signed)
Physical Therapy Evaluation Patient Details Name: Brittany Nielsen MRN: [id number] DOB: 09/20/1950 Today's Date: 02/26/2018   History of Present Illness  R direct anterior THA.  Clinical Impression  The patient  Reports pain decreased after IV medication. Tolerated  Side steps along the bed x 6. Pt admitted with above diagnosis. Pt currently with functional limitations due to the deficits listed below (see PT Problem List). Pt will benefit from skilled PT to increase their independence and safety with mobility to allow discharge to the venue listed below.       Follow Up Recommendations Follow surgeon's recommendation for DC plan and follow-up therapies;Home health PT    Equipment Recommendations  None recommended by PT    Recommendations for Other Services       Precautions / Restrictions Precautions Precautions: Fall Restrictions Weight Bearing Restrictions: No(as tolerated)      Mobility  Bed Mobility Overal bed mobility: Needs Assistance Bed Mobility: Supine to Sit;Sit to Supine     Supine to sit: Min assist;HOB elevated Sit to supine: Mod assist   General bed mobility comments: used rails and assisted with right leg  Transfers Overall transfer level: Needs assistance Equipment used: Rolling walker (2 wheeled) Transfers: Sit to/from Stand Sit to Stand: Min assist         General transfer comment: cues for hand placement and right leg position  Ambulation/Gait Ambulation/Gait assistance: Min assist   Assistive device: Rolling walker (2 wheeled) Gait Pattern/deviations: Step-to pattern     General Gait Details: side steps along the bed x 6 steps  Stairs            Wheelchair Mobility    Modified Rankin (Stroke Patients Only)       Balance                                             Pertinent Vitals/Pain Pain Assessment: 0-10 Pain Score: 7  Pain Location: right hip Pain Descriptors / Indicators:  Discomfort;Aching;Tender Pain Intervention(s): Monitored during session;Limited activity within patient's tolerance;Premedicated before session;Repositioned;Ice applied    Home Living Family/patient expects to be discharged to:: Private residence Living Arrangements: Spouse/significant other Available Help at Discharge: Family Type of Home: House Home Access: Stairs to enter Entrance Stairs-Rails: None Secretary/administrator of Steps: 2 Home Layout: One level Home Equipment: Environmental consultant - 2 wheels      Prior Function Level of Independence: Independent               Hand Dominance        Extremity/Trunk Assessment   Upper Extremity Assessment Upper Extremity Assessment: Overall WFL for tasks assessed    Lower Extremity Assessment Lower Extremity Assessment: RLE deficits/detail RLE Deficits / Details: able to bear weight        Communication   Communication: No difficulties  Cognition Arousal/Alertness: Awake/alert Behavior During Therapy: WFL for tasks assessed/performed Overall Cognitive Status: Within Functional Limits for tasks assessed                                        General Comments      Exercises Total Joint Exercises Ankle Circles/Pumps: AROM Heel Slides: AAROM;5 reps;Right   Assessment/Plan    PT Assessment Patient needs continued PT services  PT Problem List Decreased strength;Decreased  range of motion;Decreased activity tolerance;Decreased knowledge of use of DME;Decreased mobility;Decreased knowledge of precautions       PT Treatment Interventions DME instruction;Therapeutic exercise;Gait training;Stair training;Functional mobility training;Therapeutic activities;Patient/family education    PT Goals (Current goals can be found in the Care Plan section)  Acute Rehab PT Goals Patient Stated Goal: to go home PT Goal Formulation: With patient Time For Goal Achievement: 03/05/18 Potential to Achieve Goals: Good     Frequency 7X/week   Barriers to discharge        Co-evaluation               AM-PAC PT "6 Clicks" Mobility  Outcome Measure Help needed turning from your back to your side while in a flat bed without using bedrails?: A Lot Help needed moving from lying on your back to sitting on the side of a flat bed without using bedrails?: A Lot Help needed moving to and from a bed to a chair (including a wheelchair)?: A Lot Help needed standing up from a chair using your arms (e.g., wheelchair or bedside chair)?: A Lot Help needed to walk in hospital room?: A Lot Help needed climbing 3-5 steps with a railing? : Total 6 Click Score: 11    End of Session   Activity Tolerance: Patient tolerated treatment well Patient left: in bed;with call bell/phone within reach;with bed alarm set Nurse Communication: Mobility status PT Visit Diagnosis: Unsteadiness on feet (R26.81);Pain Pain - Right/Left: Right Pain - part of body: Hip;Leg    Time: 6256-3893 PT Time Calculation (min) (ACUTE ONLY): 32 min   Charges:   PT Evaluation $PT Eval Low Complexity: 1 Low PT Treatments $Gait Training: 8-22 mins        Blanchard Kelch PT Acute Rehabilitation Services Pager [phone] Office [phone]   Brittany Nielsen 02/26/2018, 4:58 PM

## 2018-02-26 NOTE — H&P (Signed)
TOTAL HIP ADMISSION H&P  Patient is admitted for right total hip arthroplasty.  Subjective:  Chief Complaint: right hip pain  HPI: Brittany Nielsen, 68 y.o. female, has a history of pain and functional disability in the right hip(s) due to arthritis and patient has failed non-surgical conservative treatments for greater than 12 weeks to include NSAID's and/or analgesics, corticosteriod injections, flexibility and strengthening excercises, use of assistive devices, weight reduction as appropriate and activity modification.  Onset of symptoms was gradual starting 3 years ago with gradually worsening course since that time.The patient noted no past surgery on the right hip(s).  Patient currently rates pain in the right hip at 10 out of 10 with activity. Patient has night pain, worsening of pain with activity and weight bearing, trendelenberg gait, pain that interfers with activities of daily living, pain with passive range of motion and crepitus. Patient has evidence of subchondral cysts, subchondral sclerosis, periarticular osteophytes and joint space narrowing by imaging studies. This condition presents safety issues increasing the risk of falls.  There is no current active infection.  Patient Active Problem List   Diagnosis Date Noted  . Unilateral primary osteoarthritis, left hip 01/25/2018  . Unilateral primary osteoarthritis, right hip 01/25/2018   Past Medical History:  Diagnosis Date  . Arthritis   . Elevated cholesterol   . Family history of adverse reaction to anesthesia    MOTHER NAUSEA   . Hx of bronchitis    I HAD IT BACK IN SEPTEMBER 2019 AND I USUALLY HAVE ONCE A YEAR   . Hx of hypokalemia   . Hypertension   . Panic attacks   . Sleep apnea    CPAP     Past Surgical History:  Procedure Laterality Date  . CHOLECYSTECTOMY    . TUBAL LIGATION      No current facility-administered medications for this encounter.    No Known Allergies  Social History   Tobacco Use   . Smoking status: Never Smoker  . Smokeless tobacco: Never Used  Substance Use Topics  . Alcohol use: Not on file    No family history on file.   Review of Systems  Musculoskeletal: Positive for joint pain.  All other systems reviewed and are negative.   Objective:  Physical Exam  Constitutional: She is oriented to person, place, and time. She appears well-developed and well-nourished.  HENT:  Head: Normocephalic and atraumatic.  Eyes: Pupils are equal, round, and reactive to light. EOM are normal.  Neck: Normal range of motion. Neck supple.  Cardiovascular: Normal rate and regular rhythm.  Respiratory: Effort normal and breath sounds normal.  GI: Soft. Bowel sounds are normal.  Musculoskeletal:     Right hip: She exhibits decreased range of motion, decreased strength, tenderness and bony tenderness.  Neurological: She is alert and oriented to person, place, and time.  Skin: Skin is warm and dry.  Psychiatric: She has a normal mood and affect.    Vital signs in last 24 hours:    Labs:   Estimated body mass index is 47.01 kg/m as calculated from the following:   Height as of 02/16/18: 5\' 2"  (1.575 m).   Weight as of 02/16/18: 116.6 kg.   Imaging Review Plain radiographs demonstrate severe degenerative joint disease of the right hip(s). The bone quality appears to be good for age and reported activity level.      Assessment/Plan:  End stage arthritis, right hip(s)  The patient history, physical examination, clinical judgement of the provider and  imaging studies are consistent with end stage degenerative joint disease of the right hip(s) and total hip arthroplasty is deemed medically necessary. The treatment options including medical management, injection therapy, arthroscopy and arthroplasty were discussed at length. The risks and benefits of total hip arthroplasty were presented and reviewed. The risks due to aseptic loosening, infection, stiffness,  dislocation/subluxation,  thromboembolic complications and other imponderables were discussed.  The patient acknowledged the explanation, agreed to proceed with the plan and consent was signed. Patient is being admitted for inpatient treatment for surgery, pain control, PT, OT, prophylactic antibiotics, VTE prophylaxis, progressive ambulation and ADL's and discharge planning.The patient is planning to be discharged home with home health services    Patient's anticipated LOS is less than 2 midnights, meeting these requirements: - Younger than 35 - Lives within 1 hour of care - Has a competent adult at home to recover with post-op recover - NO history of  - Chronic pain requiring opiods  - Diabetes  - Coronary Artery Disease  - Heart failure  - Heart attack  - Stroke  - DVT/VTE  - Cardiac arrhythmia  - Respiratory Failure/COPD  - Renal failure  - Anemia  - Advanced Liver disease

## 2018-02-26 NOTE — Anesthesia Postprocedure Evaluation (Signed)
Anesthesia Post Note  Patient: Brittany Nielsen  Procedure(s) Performed: RIGHT TOTAL HIP ARTHROPLASTY ANTERIOR APPROACH (Right Hip)     Patient location during evaluation: PACU Anesthesia Type: Spinal Level of consciousness: awake Pain management: pain level controlled Vital Signs Assessment: post-procedure vital signs reviewed and stable Respiratory status: spontaneous breathing Cardiovascular status: stable Postop Assessment: no apparent nausea or vomiting Anesthetic complications: no    Last Vitals:  Vitals:   02/26/18 1200 02/26/18 1215  BP: (!) 104/54 (!) 108/57  Pulse: 83 71  Resp: 15 14  Temp:    SpO2: 90% 94%    Last Pain:  Vitals:   02/26/18 1215  TempSrc:   PainSc: 0-No pain   Pain Goal:    LLE Motor Response: Purposeful movement (02/26/18 1215)   RLE Motor Response: Purposeful movement (02/26/18 1215)   L Sensory Level: L5-Outer lower leg, top of foot, great toe (02/26/18 1215) R Sensory Level: L5-Outer lower leg, top of foot, great toe (02/26/18 1215) Epidural/Spinal Function Patient able to flex knees: Yes (02/26/18 1215)  Caren Macadam

## 2018-02-27 DIAGNOSIS — M1612 Unilateral primary osteoarthritis, left hip: Secondary | ICD-10-CM | POA: Diagnosis not present

## 2018-02-27 DIAGNOSIS — I10 Essential (primary) hypertension: Secondary | ICD-10-CM | POA: Diagnosis not present

## 2018-02-27 DIAGNOSIS — E7800 Pure hypercholesterolemia, unspecified: Secondary | ICD-10-CM | POA: Diagnosis not present

## 2018-02-27 DIAGNOSIS — M1611 Unilateral primary osteoarthritis, right hip: Secondary | ICD-10-CM | POA: Diagnosis not present

## 2018-02-27 DIAGNOSIS — G4730 Sleep apnea, unspecified: Secondary | ICD-10-CM | POA: Diagnosis not present

## 2018-02-27 DIAGNOSIS — Z6841 Body Mass Index (BMI) 40.0 and over, adult: Secondary | ICD-10-CM | POA: Diagnosis not present

## 2018-02-27 LAB — CBC
HEMATOCRIT: 33.6 % — AB (ref 36.0–46.0)
Hemoglobin: 10.4 g/dL — ABNORMAL LOW (ref 12.0–15.0)
MCH: 27.1 pg (ref 26.0–34.0)
MCHC: 31 g/dL (ref 30.0–36.0)
MCV: 87.5 fL (ref 80.0–100.0)
PLATELETS: 277 10*3/uL (ref 150–400)
RBC: 3.84 MIL/uL — ABNORMAL LOW (ref 3.87–5.11)
RDW: 13.6 % (ref 11.5–15.5)
WBC: 14.2 10*3/uL — ABNORMAL HIGH (ref 4.0–10.5)
nRBC: 0 % (ref 0.0–0.2)

## 2018-02-27 LAB — BASIC METABOLIC PANEL
Anion gap: 8 (ref 5–15)
BUN: 15 mg/dL (ref 8–23)
CO2: 27 mmol/L (ref 22–32)
CREATININE: 0.85 mg/dL (ref 0.44–1.00)
Calcium: 8.4 mg/dL — ABNORMAL LOW (ref 8.9–10.3)
Chloride: 98 mmol/L (ref 98–111)
GFR calc Af Amer: >60 mL/min (ref >60)
GFR calc non Af Amer: >60 mL/min (ref >60)
Glucose, Bld: 212 mg/dL — ABNORMAL HIGH (ref 70–99)
Potassium: 5 mmol/L (ref 3.5–5.1)
Sodium: 133 mmol/L — ABNORMAL LOW (ref 135–145)

## 2018-02-27 MED ORDER — PROMETHAZINE HCL 25 MG/ML IJ SOLN
12.5000 mg | Freq: Three times a day (TID) | INTRAMUSCULAR | Status: DC | PRN
  Administered 2018-02-27: 12.5 mg via INTRAVENOUS
  Filled 2018-02-27: qty 1

## 2018-02-27 NOTE — Progress Notes (Signed)
     Subjective: 1 Day Post-Op Procedure(s) (LRB): RIGHT TOTAL HIP ARTHROPLASTY ANTERIOR APPROACH (Right)  Awake, alert and oriented x 4. Dressing right hip intact no drainage. No right hip or thigh numbness or paresthesias.   Patient reports pain as moderate.    Objective:   VITALS:  Temp:  [97.6 F (36.4 C)-98.2 F (36.8 C)] 97.7 F (36.5 C) (02/15 0123) Pulse Rate:  [71-99] 98 (02/15 0123) Resp:  [14-18] 18 (02/15 0123) BP: (91-136)/(53-77) 124/55 (02/15 0723) SpO2:  [90 %-97 %] 92 % (02/15 0123)  Neurologically intact ABD soft Neurovascular intact Sensation intact distally Intact pulses distally Dorsiflexion/Plantar flexion intact Incision: dressing C/D/I, no drainage and scant drainage Compartment soft   LABS Recent Labs    02/27/18 0458  HGB 10.4*  WBC 14.2*  PLT 277   Recent Labs    02/27/18 0458  NA 133*  K 5.0  CL 98  CO2 27  BUN 15  CREATININE 0.85  GLUCOSE 212*   No results for input(s): LABPT, INR in the last 72 hours.   Assessment/Plan: 1 Day Post-Op Procedure(s) (LRB): RIGHT TOTAL HIP ARTHROPLASTY ANTERIOR APPROACH (Right)  Advance diet Up with therapy D/C IV fluids Plan for discharge tomorrow Discharge home with home health  Vira Browns 02/27/2018, 10:53 AMPatient ID: Brittany Nielsen, Brittany Nielsen   DOB: [birth year], 68 y.o.   MRN: [id number]

## 2018-02-27 NOTE — Care Management Note (Addendum)
Case Management Note  Patient Details  Name: Brittany Nielsen MRN: [id number] Date of Birth: 23-May-1950  Subjective/Objective:    R THA                Action/Plan: NCM spoke to pt and offered choice for HH/Medicare list provided and placed on chart. Pt has RW at home. Husband will assist with care.   02/28/2018 Pt states she does not have a 3n1 bedside commode. Contacted [facility] for 3n1 for home.    Expected Discharge Date:                  Expected Discharge Plan:  Home w Home Health Services  In-House Referral:  NA  Discharge planning Services  CM Consult  Post Acute Care Choice:  Home Health Choice offered to:  Patient  DME Arranged:  N/A DME Agency:  NA  HH Arranged:  PT HH Agency:  [facility]  Status of Service:  Completed, signed off  If discussed at [employer], dates discussed:    Additional Comments:  Elliot Cousin, RN 02/27/2018, 5:37 PM

## 2018-02-27 NOTE — Progress Notes (Signed)
Physical Therapy Treatment Patient Details Name: Brittany Nielsen MRN: [id number] DOB: 04-15-1950 Today's Date: 02/27/2018    History of Present Illness R direct anterior THA PMH: anxiety    PT Comments    Patient is making slow , steady progress. Not ready formDC. Has a family member w/ flu also. Continue PT.  Follow Up Recommendations  Follow surgeon's recommendation for DC plan and follow-up therapies;Home health PT     Equipment Recommendations  None recommended by PT    Recommendations for Other Services       Precautions / Restrictions Precautions Precautions: Fall Restrictions Weight Bearing Restrictions: No    Mobility  Bed Mobility Overal bed mobility: Needs Assistance Bed Mobility: Sit to Supine     Supine to sit: Mod assist;HOB elevated Sit to supine: Mod assist   General bed mobility comments: oob, assist legs onto bed.  Transfers Overall transfer level: Needs assistance Equipment used: Rolling walker (2 wheeled) Transfers: Sit to/from Stand Sit to Stand: Min assist;+2 safety/equipment Stand pivot transfers: Min assist;+2 safety/equipment       General transfer comment: cues for technique, steadying assist  Ambulation/Gait Ambulation/Gait assistance: Min assist Gait Distance (Feet): 20 Feet Assistive device: Rolling walker (2 wheeled) Gait Pattern/deviations: Step-to pattern;Step-through pattern Gait velocity: decr   General Gait Details: cues for sequence and posture   Stairs             Wheelchair Mobility    Modified Rankin (Stroke Patients Only)       Balance                                            Cognition Arousal/Alertness: Awake/alert Behavior During Therapy: Anxious Overall Cognitive Status: Within Functional Limits for tasks assessed                                        Exercises      General Comments        Pertinent Vitals/Pain Pain Assessment:  Faces Pain Score: 3  Faces Pain Scale: Hurts a little bit Pain Location: right hip Pain Descriptors / Indicators: Discomfort;Aching;Tender Pain Intervention(s): Monitored during session;Limited activity within patient's tolerance;Repositioned;Premedicated before session    Home Living Family/patient expects to be discharged to:: Private residence Living Arrangements: Spouse/significant other Available Help at Discharge: Family Type of Home: House Home Access: Stairs to enter Entrance Stairs-Rails: None Home Layout: One level Home Equipment: Environmental consultant - 2 wheels;Cane - single point      Prior Function Level of Independence: Needs assistance    ADL's / Homemaking Assistance Needed: assist for socks     PT Goals (current goals can now be found in the care plan section) Acute Rehab PT Goals Patient Stated Goal: to go home Progress towards PT goals: Progressing toward goals    Frequency    7X/week      PT Plan Current plan remains appropriate    Co-evaluation PT/OT/SLP Co-Evaluation/Treatment: Yes Reason for Co-Treatment: For patient/therapist safety PT goals addressed during session: Mobility/safety with mobility OT goals addressed during session: ADL's and self-care      AM-PAC PT "6 Clicks" Mobility   Outcome Measure  Help needed turning from your back to your side while in a flat bed without using bedrails?: A Lot Help needed moving from  lying on your back to sitting on the side of a flat bed without using bedrails?: A Lot Help needed moving to and from a bed to a chair (including a wheelchair)?: A Lot Help needed standing up from a chair using your arms (e.g., wheelchair or bedside chair)?: A Lot Help needed to walk in hospital room?: A Lot Help needed climbing 3-5 steps with a railing? : Total 6 Click Score: 11    End of Session Equipment Utilized During Treatment: Gait belt Activity Tolerance: Patient tolerated treatment well Patient left: in bed;with bed  alarm set;with call bell/phone within reach;with family/visitor present Nurse Communication: Mobility status PT Visit Diagnosis: Unsteadiness on feet (R26.81);Pain Pain - Right/Left: Right Pain - part of body: Hip;Leg     Time: 1255-1305 PT Time Calculation (min) (ACUTE ONLY): 10 min  Charges:  $Gait Training: 8-22 mins                     Blanchard Kelch PT Acute Rehabilitation Services Pager [phone] Office [phone]    Rada Hay 02/27/2018, 2:02 PM

## 2018-02-27 NOTE — Progress Notes (Signed)
Physical Therapy Treatment Patient Details Name: Brittany Nielsen MRN: [id number] DOB: 25-May-1950 Today's Date: 02/27/2018    History of Present Illness R direct anterior THA PMH: anxiety    PT Comments    The patient mobilized well to [facility] then ambulated x 15' x2 with RW. Patient is not at a functional level ready for Dc at this time. Continue PT.  Follow Up Recommendations  Follow surgeon's recommendation for DC plan and follow-up therapies;Home health PT     Equipment Recommendations  None recommended by PT    Recommendations for Other Services       Precautions / Restrictions Precautions Precautions: Fall Restrictions Weight Bearing Restrictions: No    Mobility  Bed Mobility Overal bed mobility: Needs Assistance Bed Mobility: Supine to Sit     Supine to sit: Mod assist;HOB elevated     General bed mobility comments: assist for LEs over EOB and to raise trunk  Transfers Overall transfer level: Needs assistance Equipment used: Rolling walker (2 wheeled) Transfers: Sit to/from [employer] Sit to Stand: Min assist;+2 safety/equipment Stand pivot transfers: Min assist;+2 safety/equipment       General transfer comment: cues for technique, steadying assist  Ambulation/Gait Ambulation/Gait assistance: Min assist Gait Distance (Feet): 15 Feet(x 2) Assistive device: Rolling walker (2 wheeled) Gait Pattern/deviations: Step-to pattern Gait velocity: decr   General Gait Details: cues for sequence and posture   Stairs             Wheelchair Mobility    Modified Rankin (Stroke Patients Only)       Balance                                            Cognition Arousal/Alertness: Awake/alert Behavior During Therapy: Anxious Overall Cognitive Status: Within Functional Limits for tasks assessed                                        Exercises      General Comments        Pertinent  Vitals/Pain Pain Assessment: Faces Faces Pain Scale: Hurts a little bit Pain Location: right hip Pain Descriptors / Indicators: Discomfort;Aching;Tender Pain Intervention(s): Limited activity within patient's tolerance;Monitored during session;Premedicated before session;Repositioned    Home Living Family/patient expects to be discharged to:: Private residence Living Arrangements: Spouse/significant other Available Help at Discharge: Family Type of Home: House Home Access: Stairs to enter Entrance Stairs-Rails: None Home Layout: One level Home Equipment: Environmental consultant - 2 wheels;Cane - single point      Prior Function Level of Independence: Needs assistance    ADL's / Homemaking Assistance Needed: assist for socks     PT Goals (current goals can now be found in the care plan section) Acute Rehab PT Goals Patient Stated Goal: to go home Progress towards PT goals: Progressing toward goals    Frequency    7X/week      PT Plan Current plan remains appropriate    Co-evaluation PT/OT/SLP Co-Evaluation/Treatment: Yes Reason for Co-Treatment: For patient/therapist safety PT goals addressed during session: Mobility/safety with mobility OT goals addressed during session: ADL's and self-care      AM-PAC PT "6 Clicks" Mobility   Outcome Measure  Help needed turning from your back to your side while in a flat bed without  using bedrails?: A Lot Help needed moving from lying on your back to sitting on the side of a flat bed without using bedrails?: A Lot Help needed moving to and from a bed to a chair (including a wheelchair)?: A Lot Help needed standing up from a chair using your arms (e.g., wheelchair or bedside chair)?: A Lot Help needed to walk in hospital room?: A Lot Help needed climbing 3-5 steps with a railing? : Total 6 Click Score: 11    End of Session Equipment Utilized During Treatment: Gait belt Activity Tolerance: Patient tolerated treatment well Patient left: in  chair;with call bell/phone within reach;with chair alarm set;with family/visitor present Nurse Communication: Mobility status PT Visit Diagnosis: Unsteadiness on feet (R26.81);Pain Pain - Right/Left: Right Pain - part of body: Hip;Leg     Time: 1033-1100 PT Time Calculation (min) (ACUTE ONLY): 27 min  Charges:  $Gait Training: 8-22 mins                     Blanchard Kelch PT Acute Rehabilitation Services Pager [phone] Office [phone]    Rada Hay 02/27/2018, 1:57 PM

## 2018-02-27 NOTE — Evaluation (Signed)
Occupational Therapy Evaluation Patient Details Name: Brittany Nielsen MRN: [id number] DOB: 1950/03/13 Today's Date: 02/27/2018    History of Present Illness R direct anterior THA PMH: anxiety   Clinical Impression   Pt was assisted for donning her socks and wore slip on shoes prior to admission, she was otherwise functioning independently. Pt presents with anxiety, generalized weakness and decreased standing balance. She requires min to max assist for ADL and min assist for transfer to [facility]. Will follow acutely.     Follow Up Recommendations  Follow surgeon's recommendation for DC plan and follow-up therapies    Equipment Recommendations  3 in 1 bedside commode;Tub/shower bench(bariatric)    Recommendations for Other Services       Precautions / Restrictions Precautions Precautions: Fall Restrictions Weight Bearing Restrictions: No      Mobility Bed Mobility Overal bed mobility: Needs Assistance Bed Mobility: Supine to Sit     Supine to sit: Mod assist;HOB elevated     General bed mobility comments: assist for LEs over EOB and to raise trunk  Transfers Overall transfer level: Needs assistance Equipment used: Rolling walker (2 wheeled) Transfers: Sit to/from [employer] Sit to Stand: Min assist;+2 safety/equipment Stand pivot transfers: Min assist;+2 safety/equipment       General transfer comment: cues for technique, steadying assist    Balance                                           ADL either performed or assessed with clinical judgement   ADL Overall ADL's : Needs assistance/impaired Eating/Feeding: Independent;Sitting   Grooming: Set up;Sitting   Upper Body Bathing: Minimal assistance;Sitting   Lower Body Bathing: Maximal assistance;Sit to/from stand   Upper Body Dressing : Minimal assistance;Sitting   Lower Body Dressing: Maximal assistance;Sit to/from stand   Toilet Transfer: Minimal  assistance;Stand-pivot;RW;+2 for safety/equipment;BSC   Toileting- Clothing Manipulation and Hygiene: Maximal assistance;Sit to/from stand       Functional mobility during ADLs: Minimal assistance;Rolling walker;+2 for safety/equipment       Vision Baseline Vision/History: Wears glasses Patient Visual Report: No change from baseline       Perception     Praxis      Pertinent Vitals/Pain Pain Assessment: Faces Faces Pain Scale: Hurts a little bit Pain Location: right hip Pain Descriptors / Indicators: Discomfort;Aching;Tender Pain Intervention(s): Monitored during session;Repositioned     Hand Dominance Right   Extremity/Trunk Assessment Upper Extremity Assessment Upper Extremity Assessment: Overall WFL for tasks assessed(tremulous)   Lower Extremity Assessment Lower Extremity Assessment: Defer to PT evaluation   Cervical / Trunk Assessment Cervical / Trunk Assessment: Other exceptions(obesity)   Communication Communication Communication: No difficulties   Cognition Arousal/Alertness: Awake/alert Behavior During Therapy: Anxious Overall Cognitive Status: Within Functional Limits for tasks assessed                                     General Comments       Exercises     Shoulder Instructions      Home Living Family/patient expects to be discharged to:: Private residence Living Arrangements: Spouse/significant other Available Help at Discharge: Family Type of Home: House Home Access: Stairs to enter Secretary/administrator of Steps: 2 Entrance Stairs-Rails: None Home Layout: One level     Bathroom Shower/Tub: Tub/shower unit  Bathroom Toilet: Standard     Home Equipment: Environmental consultant - 2 wheels;Cane - single point          Prior Functioning/Environment Level of Independence: Needs assistance    ADL's / Homemaking Assistance Needed: assist for socks            OT Problem List: Decreased strength;Decreased activity  tolerance;Impaired balance (sitting and/or standing);Decreased knowledge of use of DME or AE;Decreased safety awareness;Pain;Obesity      OT Treatment/Interventions: Self-care/ADL training;DME and/or AE instruction;Patient/family education;Balance training;Therapeutic activities    OT Goals(Current goals can be found in the care plan section) Acute Rehab OT Goals Patient Stated Goal: to go home OT Goal Formulation: With patient Time For Goal Achievement: 03/13/18 Potential to Achieve Goals: Good  OT Frequency: Min 2X/week   Barriers to D/C:            Co-evaluation PT/OT/SLP Co-Evaluation/Treatment: Yes Reason for Co-Treatment: For patient/therapist safety   OT goals addressed during session: ADL's and self-care      AM-PAC OT "6 Clicks" Daily Activity     Outcome Measure Help from another person eating meals?: None Help from another person taking care of personal grooming?: A Little Help from another person toileting, which includes using toliet, bedpan, or urinal?: A Lot Help from another person bathing (including washing, rinsing, drying)?: A Lot Help from another person to put on and taking off regular upper body clothing?: A Little Help from another person to put on and taking off regular lower body clothing?: A Lot 6 Click Score: 16   End of Session Equipment Utilized During Treatment: Gait belt;Rolling walker Nurse Communication: Mobility status  Activity Tolerance: Patient tolerated treatment well Patient left: in chair;with call bell/phone within reach;with chair alarm set;with family/visitor present  OT Visit Diagnosis: Unsteadiness on feet (R26.81);Other abnormalities of gait and mobility (R26.89);Pain;Muscle weakness (generalized) (M62.81)                Time: 1761-6073 OT Time Calculation (min): 30 min Charges:  OT General Charges $OT Visit: 1 Visit OT Evaluation $OT Eval Moderate Complexity: 1 Mod  [name], OTR/L Acute Rehabilitation  Services Pager: [phone] Office: [phone]  [name] 02/27/2018, 1:35 PM

## 2018-02-27 NOTE — Progress Notes (Signed)
Patient seems having panic attack last night, pt stated that she was so afraid and she needs somebody to stay with her at bedside; pt has episodes of nausea but not vomiting - PRN was given with some relief; family came over to stay and pt seems relief after; will continue to monitor pt.

## 2018-02-28 LAB — CBC
HCT: 30 % — ABNORMAL LOW (ref 36.0–46.0)
Hemoglobin: 9.1 g/dL — ABNORMAL LOW (ref 12.0–15.0)
MCH: 27.5 pg (ref 26.0–34.0)
MCHC: 30.3 g/dL (ref 30.0–36.0)
MCV: 90.6 fL (ref 80.0–100.0)
NRBC: 0 % (ref 0.0–0.2)
Platelets: 240 10*3/uL (ref 150–400)
RBC: 3.31 MIL/uL — AB (ref 3.87–5.11)
RDW: 14.1 % (ref 11.5–15.5)
WBC: 10.3 10*3/uL (ref 4.0–10.5)

## 2018-02-28 MED ORDER — ASPIRIN 81 MG PO CHEW: 81.0000 mg | CHEWABLE_TABLET | Freq: Two times a day (BID) | ORAL | 0 refills | Status: DC

## 2018-02-28 MED ORDER — OXYCODONE HCL 5 MG PO TABS: 5.0000 mg | ORAL_TABLET | ORAL | 0 refills | Status: DC | PRN

## 2018-02-28 MED ORDER — METHOCARBAMOL 500 MG PO TABS: 500.0000 mg | ORAL_TABLET | Freq: Four times a day (QID) | ORAL | 1 refills | Status: DC | PRN

## 2018-02-28 NOTE — Progress Notes (Signed)
Subjective: 2 Days Post-Op Procedure(s) (LRB): RIGHT TOTAL HIP ARTHROPLASTY ANTERIOR APPROACH (Right) Patient reports pain as moderate.    Objective: Vital signs in last 24 hours: Temp:  [98.7 F (37.1 C)-99.5 F (37.5 C)] 98.7 F (37.1 C) (02/16 0609) Pulse Rate:  [85-86] 85 (02/16 0609) Resp:  [14-16] 16 (02/16 0609) BP: (105-126)/(47-70) 105/47 (02/16 0609) SpO2:  [90 %-96 %] 90 % (02/16 0609)  Intake/Output from previous day: 02/15 0701 - 02/16 0700 In: 2978.4 [P.O.:1070; I.V.:1908.4] Out: 1300 [Urine:1300] Intake/Output this shift: No intake/output data recorded.  Recent Labs    02/27/18 0458 02/28/18 0405  HGB 10.4* 9.1*   Recent Labs    02/27/18 0458 02/28/18 0405  WBC 14.2* 10.3  RBC 3.84* 3.31*  HCT 33.6* 30.0*  PLT 277 240   Recent Labs    02/27/18 0458  NA 133*  K 5.0  CL 98  CO2 27  BUN 15  CREATININE 0.85  GLUCOSE 212*  CALCIUM 8.4*   No results for input(s): LABPT, INR in the last 72 hours.  Sensation intact distally Intact pulses distally Dorsiflexion/Plantar flexion intact Incision: scant drainage   Assessment/Plan: 2 Days Post-Op Procedure(s) (LRB): RIGHT TOTAL HIP ARTHROPLASTY ANTERIOR APPROACH (Right) Up with therapy Discharge home with home health this afternoon.      Kathryne Hitch 02/28/2018, 9:22 AM

## 2018-02-28 NOTE — Progress Notes (Signed)
Occupational Therapy Treatment Patient Details Name: Brittany Nielsen MRN: [id number] DOB: 02/04/1950 Today's Date: 02/28/2018    History of present illness R direct anterior THA PMH: anxiety   OT comments  Pt has been routinely using BSC with sister's assist. Educated in use of AE and tub transfer bench. Pt declined OOB to chair. Wants to work with PT with goal of going home today.  Follow Up Recommendations  Follow surgeon's recommendation for DC plan and follow-up therapies    Equipment Recommendations  3 in 1 bedside commode;Tub/shower bench    Recommendations for Other Services      Precautions / Restrictions Precautions Precautions: Fall       Mobility Bed Mobility Overal bed mobility: Needs Assistance Bed Mobility: Sidelying to Sit;Sit to Sidelying   Sidelying to sit: Min assist     Sit to sidelying: Mod assist General bed mobility comments: increased time, assist for LEs in and OOB  Transfers Overall transfer level: Needs assistance Equipment used: Rolling walker (2 wheeled) Transfers: Sit to/from [employer] Sit to Stand: Min guard Stand pivot transfers: Min guard       General transfer comment: cues for technique, steadying assist, increased time    Balance                                           ADL either performed or assessed with clinical judgement   ADL Overall ADL's : Needs assistance/impaired     Grooming: Wash/dry hands;Wash/dry face;Sitting;Set up         Lower Body Bathing Details (indicate cue type and reason): educated in use of long handled bath sponge     Lower Body Dressing: Minimal assistance;Sitting/lateral leans Lower Body Dressing Details (indicate cue type and reason): educated in use of sock aid, reacher and long handled shoe horn Toilet Transfer: Min guard;Stand-pivot;BSC;RW   Toileting- Clothing Manipulation and Hygiene: Minimal assistance;Sit to/from Counsellor Details (indicate cue type and reason): demonstrated use of tub transfer bench         Vision       Perception     Praxis      Cognition Arousal/Alertness: Awake/alert Behavior During Therapy: Flat affect Overall Cognitive Status: Within Functional Limits for tasks assessed                                 General Comments: less anxious today, but requiring encouragement to work with therapy        Exercises     Shoulder Instructions       General Comments      Pertinent Vitals/ Pain       Pain Assessment: 0-10 Pain Score: 4  Pain Location: right hip Pain Descriptors / Indicators: Discomfort;Aching;Tender Pain Intervention(s): Monitored during session;Premedicated before session  Home Living                                          Prior Functioning/Environment              Frequency  Min 2X/week        Progress Toward Goals  OT Goals(current goals can now be found in the care plan section)  Progress towards OT goals: Progressing toward goals  Acute Rehab OT Goals Patient Stated Goal: to go home later today OT Goal Formulation: With patient Time For Goal Achievement: 03/13/18 Potential to Achieve Goals: Good  Plan Discharge plan remains appropriate    Co-evaluation                 AM-PAC OT "6 Clicks" Daily Activity     Outcome Measure   Help from another person eating meals?: None Help from another person taking care of personal grooming?: A Little Help from another person toileting, which includes using toliet, bedpan, or urinal?: A Little Help from another person bathing (including washing, rinsing, drying)?: A Lot Help from another person to put on and taking off regular upper body clothing?: A Little Help from another person to put on and taking off regular lower body clothing?: A Lot 6 Click Score: 17    End of Session Equipment Utilized During Treatment: Gait belt;Rolling  walker  OT Visit Diagnosis: Unsteadiness on feet (R26.81);Other abnormalities of gait and mobility (R26.89);Pain;Muscle weakness (generalized) (M62.81)   Activity Tolerance Patient tolerated treatment well   Patient Left with call bell/phone within reach;with family/visitor present;in bed(pt declined up in chair)   Nurse Communication          Time: 4888-9169 OT Time Calculation (min): 20 min  Charges: OT General Charges $OT Visit: 1 Visit OT Treatments $Self Care/Home Management : 8-22 mins  [name], OTR/L Acute Rehabilitation Services Pager: [phone] Office: [phone]   [name] 02/28/2018, 10:53 AM

## 2018-02-28 NOTE — Discharge Instructions (Signed)

## 2018-02-28 NOTE — Progress Notes (Signed)
Physical Therapy Treatment Patient Details Name: Brittany Nielsen MRN: [id number] DOB: 1950-03-17 Today's Date: 02/28/2018    History of Present Illness R direct anterior THA PMH: anxiety    PT Comments    Practiced steps backwards, reviewed HEP. Ready for Dc.  Follow Up Recommendations  Follow surgeon's recommendation for DC plan and follow-up therapies;Home health PT     Equipment Recommendations  None recommended by PT    Recommendations for Other Services       Precautions / Restrictions Precautions Precautions: Fall    Mobility  Bed Mobility Overal bed mobility: Needs Assistance Bed Mobility: Sidelying to Sit   Sidelying to sit: Min guard     Sit to sidelying: Mod assist General bed mobility comments: increased time,   Transfers Overall transfer level: Needs assistance Equipment used: Rolling walker (2 wheeled) Transfers: Sit to/from Stand Sit to Stand: Min guard Stand pivot transfers: Min guard       General transfer comment: cues for technique, , increased time  Ambulation/Gait Ambulation/Gait assistance: Min assist Gait Distance (Feet): 40 Feet Assistive device: Rolling walker (2 wheeled) Gait Pattern/deviations: Step-to pattern;Step-through pattern Gait velocity: decr   General Gait Details: cues for sequence and posture   Stairs Stairs: Yes Stairs assistance: Min assist Stair Management: Step to pattern;Backwards;With walker Number of Stairs: 3 General stair comments: cues for technique, sister present, spouse not   Wheelchair Mobility    Modified Rankin (Stroke Patients Only)       Balance                                            Cognition Arousal/Alertness: Awake/alert Behavior During Therapy: Flat affect Overall Cognitive Status: Within Functional Limits for tasks assessed                                 General Comments: less anxious today, but requiring encouragement to work  with therapy      Exercises Total Joint Exercises Ankle Circles/Pumps: AROM Quad Sets: AROM;Both;10 reps Short Arc Quad: AROM;Right;10 reps Heel Slides: AAROM;Right;10 reps Hip ABduction/ADduction: AAROM;Right;10 reps Long Arc Quad: AROM;Right;10 reps    General Comments        Pertinent Vitals/Pain Pain Assessment: 0-10 Pain Score: 4  Faces Pain Scale: Hurts a little bit Pain Location: right hip Pain Descriptors / Indicators: Discomfort;Aching;Tender Pain Intervention(s): Monitored during session;Premedicated before session;Ice applied    Home Living                      Prior Function            PT Goals (current goals can now be found in the care plan section) Acute Rehab PT Goals Patient Stated Goal: to go home later today Progress towards PT goals: Progressing toward goals    Frequency    7X/week      PT Plan Current plan remains appropriate    Co-evaluation              AM-PAC PT "6 Clicks" Mobility   Outcome Measure  Help needed turning from your back to your side while in a flat bed without using bedrails?: A Lot Help needed moving from lying on your back to sitting on the side of a flat bed without using bedrails?: A Lot Help needed  moving to and from a bed to a chair (including a wheelchair)?: A Lot Help needed standing up from a chair using your arms (e.g., wheelchair or bedside chair)?: A Lot Help needed to walk in hospital room?: A Lot Help needed climbing 3-5 steps with a railing? : A Lot 6 Click Score: 12    End of Session Equipment Utilized During Treatment: Gait belt Activity Tolerance: Patient tolerated treatment well Patient left: in chair;with call bell/phone within reach;with family/visitor present Nurse Communication: Mobility status PT Visit Diagnosis: Unsteadiness on feet (R26.81);Pain Pain - Right/Left: Right Pain - part of body: Hip;Leg     Time: 5597-4163 PT Time Calculation (min) (ACUTE ONLY): 24  min  Charges:  $Gait Training: 8-22 mins $Therapeutic Exercise: 8-22 mins                     Blanchard Kelch PT Acute Rehabilitation Services Pager [phone] Office [phone]    Rada Hay 02/28/2018, 1:42 PM

## 2018-02-28 NOTE — Discharge Summary (Signed)
Patient ID: Brightyn Zerr MRN: [id number] DOB/AGE: 1950-08-24 68 y.o.  Admit date: 02/26/2018 Discharge date: 02/28/2018  Admission Diagnoses:  Principal Problem:   Unilateral primary osteoarthritis, right hip Active Problems:   Status post total replacement of right hip   Discharge Diagnoses:  Same  Past Medical History:  Diagnosis Date  . Arthritis   . Elevated cholesterol   . Family history of adverse reaction to anesthesia    MOTHER NAUSEA   . Hx of bronchitis    I HAD IT BACK IN SEPTEMBER 2019 AND I USUALLY HAVE ONCE A YEAR   . Hx of hypokalemia   . Hypertension   . Panic attacks   . Sleep apnea    CPAP     Surgeries: Procedure(s): RIGHT TOTAL HIP ARTHROPLASTY ANTERIOR APPROACH on 02/26/2018   Consultants:   Discharged Condition: Improved  Hospital Course: Renae Amelita Whiteaker is an 68 y.o. female who was admitted 02/26/2018 for operative treatment ofUnilateral primary osteoarthritis, right hip. Patient has severe unremitting pain that affects sleep, daily activities, and work/hobbies. After pre-op clearance the patient was taken to the operating room on 02/26/2018 and underwent  Procedure(s): RIGHT TOTAL HIP ARTHROPLASTY ANTERIOR APPROACH.    Patient was given perioperative antibiotics:  Anti-infectives (From admission, onward)   Start     Dose/Rate Route Frequency Ordered Stop   02/26/18 1600  ceFAZolin (ANCEF) IVPB 1 g/50 mL premix     1 g 100 mL/hr over 30 Minutes Intravenous Every 6 hours 02/26/18 1300 02/26/18 2309   02/26/18 0745  ceFAZolin (ANCEF) IVPB 2g/100 mL premix     2 g 200 mL/hr over 30 Minutes Intravenous On call to O.R. 02/26/18 2778 02/26/18 1000       Patient was given sequential compression devices, early ambulation, and chemoprophylaxis to prevent DVT.  Patient benefited maximally from hospital stay and there were no complications.    Recent vital signs:  Patient Vitals for the past 24 hrs:  BP Temp Temp src Pulse Resp  SpO2  02/28/18 0609 (!) 105/47 98.7 F (37.1 C) Axillary 85 16 90 %  02/27/18 2204 (!) 126/51 99.5 F (37.5 C) Axillary 86 16 93 %  02/27/18 1300 118/70 98.7 F (37.1 C) - 85 14 96 %     Recent laboratory studies:  Recent Labs    02/27/18 0458 02/28/18 0405  WBC 14.2* 10.3  HGB 10.4* 9.1*  HCT 33.6* 30.0*  PLT 277 240  NA 133*  --   K 5.0  --   CL 98  --   CO2 27  --   BUN 15  --   CREATININE 0.85  --   GLUCOSE 212*  --   CALCIUM 8.4*  --      Discharge Medications:   Allergies as of 02/28/2018   No Known Allergies     Medication List    STOP taking these medications   aspirin EC 81 MG tablet Replaced by:  aspirin 81 MG chewable tablet     TAKE these medications   amLODipine 10 MG tablet Commonly known as:  NORVASC Take 10 mg by mouth at bedtime.   aspirin 81 MG chewable tablet Chew 1 tablet (81 mg total) by mouth 2 (two) times daily. Replaces:  aspirin EC 81 MG tablet   lisinopril-hydrochlorothiazide 20-12.5 MG tablet Commonly known as:  PRINZIDE,ZESTORETIC Take 2 tablets by mouth at bedtime.   methocarbamol 500 MG tablet Commonly known as:  ROBAXIN Take 1 tablet (500 mg total) by  mouth every 6 (six) hours as needed for muscle spasms.   oxyCODONE 5 MG immediate release tablet Commonly known as:  Oxy IR/ROXICODONE Take 1-2 tablets (5-10 mg total) by mouth every 4 (four) hours as needed for moderate pain (pain score 4-6).   PARoxetine 40 MG tablet Commonly known as:  PAXIL Take 40 mg by mouth at bedtime.   potassium chloride 10 MEQ tablet Commonly known as:  K-DUR,KLOR-CON Take 20 mEq by mouth 2 (two) times daily.   rosuvastatin 10 MG tablet Commonly known as:  CRESTOR Take 10 mg by mouth at bedtime.            Durable Medical Equipment  (From admission, onward)         Start     Ordered   02/26/18 1301  DME 3 n 1  Once     02/26/18 1300   02/26/18 1301  DME Walker rolling  Once    Question:  Patient needs a walker to treat with  the following condition  Answer:  Status post total replacement of right hip   02/26/18 1300          Diagnostic Studies: Dg Pelvis Portable  Result Date: 02/26/2018 CLINICAL DATA:  Status post right total hip replacement. EXAM: PORTABLE PELVIS 1-2 VIEWS COMPARISON:  Fluoroscopic images of same day. FINDINGS: The right femoral and acetabular components appear to be well situated. Expected postoperative changes are seen in the surrounding soft tissues. IMPRESSION: Status post right total hip arthroplasty. Electronically Signed   By: Lupita Raider, M.D.   On: 02/26/2018 13:43   Dg C-arm 1-60 Min  Result Date: 02/26/2018 CLINICAL DATA:  Total right hip replaced EXAM: DG C-ARM 61-120 MIN COMPARISON:  None. FINDINGS: Two fluoroscopic images show a total right hip arthroplasty. No evidence of periprosthetic fracture. IMPRESSION: Fluoroscopy for total hip arthroplasty. Electronically Signed   By: Marnee Spring M.D.   On: 02/26/2018 11:38   Dg Hip Operative Unilat W Or W/o Pelvis Right  Result Date: 02/26/2018 CLINICAL DATA:  Total right hip arthroplasty. EXAM: OPERATIVE right HIP (WITH PELVIS IF PERFORMED) 2 VIEWS TECHNIQUE: Fluoroscopic spot image(s) were submitted for interpretation post-operatively. COMPARISON:  None. FINDINGS: Well seated components of a total right hip arthroplasty. No complicating features are demonstrated. IMPRESSION: Right total hip arthroplasty components in good position and no complicating features. Electronically Signed   By: Rudie Meyer M.D.   On: 02/26/2018 11:36    Disposition: Discharge disposition: 01-Home or Self Care         Follow-up Information    [facility] Follow up.   Why:  Home Health Physical Therapy-agency will call to arrange initial visit Contact information: [phone]        Kathryne Hitch, MD Follow up in 2 week(s).   Specialty:  Orthopedic Surgery Contact information: [address] [place]  [address] [phone]            Signed: Kathryne Hitch 02/28/2018, 9:28 AM

## 2018-03-01 ENCOUNTER — Encounter (HOSPITAL_COMMUNITY): Payer: Self-pay | Admitting: Orthopaedic Surgery

## 2018-03-01 LAB — HEMOGLOBIN A1C
HEMOGLOBIN A1C: 6.5 % — AB (ref 4.8–5.6)
Mean Plasma Glucose: 140 mg/dL

## 2018-03-11 ENCOUNTER — Encounter (INDEPENDENT_AMBULATORY_CARE_PROVIDER_SITE_OTHER): Payer: Self-pay | Admitting: Orthopaedic Surgery

## 2018-03-11 ENCOUNTER — Ambulatory Visit (INDEPENDENT_AMBULATORY_CARE_PROVIDER_SITE_OTHER): Payer: Medicare Other | Admitting: Orthopaedic Surgery

## 2018-03-11 DIAGNOSIS — Z96641 Presence of right artificial hip joint: Secondary | ICD-10-CM

## 2018-03-11 NOTE — Progress Notes (Signed)
The patient is now 2 weeks tomorrow status post a right total hip arthroplasty.  She is someone who is morbidly obese however we are very comfortable doing her surgery and was not difficult.  She is ambulate with a walker and says she is doing well overall and is very happy.  She says her range of motion and strength are increased.  She is on an aspirin twice daily.  She was on a baby aspirin once daily before this.  I told her she can go back to once daily aspirin.  She is not taking any type of pain medicine or muscle relaxant at all.  She is on a walker still.  She is had home health therapy.  On exam her incision looks great.  Remove the sutures in place Steri-Strips.  I talked her about wound care and making sure she keeps this area dry and watching it closely.  All question concerns were answered and addressed.  She will continue increase mobility as comfort allows and transition to a cane when she is ready.  We will see her back in 4 weeks to see how she is doing overall but no x-rays are needed.  If there is any problems before then she will let us know.

## 2018-04-06 ENCOUNTER — Telehealth (INDEPENDENT_AMBULATORY_CARE_PROVIDER_SITE_OTHER): Payer: Self-pay | Admitting: Radiology

## 2018-04-06 NOTE — Telephone Encounter (Signed)
R/s appt, NO to all COVID-19 questions

## 2018-04-08 ENCOUNTER — Telehealth (INDEPENDENT_AMBULATORY_CARE_PROVIDER_SITE_OTHER): Payer: Self-pay | Admitting: Orthopaedic Surgery

## 2018-04-08 ENCOUNTER — Ambulatory Visit (INDEPENDENT_AMBULATORY_CARE_PROVIDER_SITE_OTHER): Payer: Medicare Other | Admitting: Orthopaedic Surgery

## 2018-04-08 NOTE — Telephone Encounter (Signed)
I have the signed orders and will fax back today

## 2018-04-08 NOTE — Telephone Encounter (Signed)
Have you seen these by chance?

## 2018-04-08 NOTE — Telephone Encounter (Signed)
Vickie from [facility] in [place], [place] called to f/u in regards to the orders that she faxed to our office.  CB#[phone].  Thank you.

## 2018-04-13 ENCOUNTER — Telehealth (INDEPENDENT_AMBULATORY_CARE_PROVIDER_SITE_OTHER): Payer: Self-pay | Admitting: Radiology

## 2018-04-13 NOTE — Telephone Encounter (Signed)
She should still re-schedule for 2-3 more weeks from now.  We can always answer any patients over the phone for her or send in any meds if needed.

## 2018-04-13 NOTE — Telephone Encounter (Signed)
Patient answered no to all screening questions, except cough. Patient has been coughing and coughing up phlegm, patient does have seasonal allergies.  Made patient aware that I'll let Dr. Magnus Ivan know of cough and she will be called if appointment needed to be changed.

## 2018-04-13 NOTE — Telephone Encounter (Signed)
Spoke with patient and rescheduled her.

## 2018-04-14 ENCOUNTER — Ambulatory Visit (INDEPENDENT_AMBULATORY_CARE_PROVIDER_SITE_OTHER): Payer: Medicare Other | Admitting: Orthopaedic Surgery

## 2018-05-04 ENCOUNTER — Ambulatory Visit (INDEPENDENT_AMBULATORY_CARE_PROVIDER_SITE_OTHER): Payer: Medicare Other | Admitting: Orthopaedic Surgery

## 2018-05-04 ENCOUNTER — Encounter (INDEPENDENT_AMBULATORY_CARE_PROVIDER_SITE_OTHER): Payer: Self-pay | Admitting: Orthopaedic Surgery

## 2018-05-04 ENCOUNTER — Other Ambulatory Visit: Payer: Self-pay

## 2018-05-04 DIAGNOSIS — Z96641 Presence of right artificial hip joint: Secondary | ICD-10-CM

## 2018-05-04 MED ORDER — DICLOFENAC SODIUM 1 % TD GEL: 2.0000 g | Freq: Four times a day (QID) | TRANSDERMAL | 3 refills | Status: AC

## 2018-05-04 NOTE — Progress Notes (Signed)
The patient is 67 days status post a right total hip arthroplasty.  She is ambulating using a cane.  She is doing well overall.  She reports good range of motion and strength and says she is significantly better than what she was preoperative.  She has been dealing though with right hip bursitis.  She is not requesting an injection and says she does not need one thus far.  She is someone who is significantly obese.  She is very pleasant.  On exam she tolerates me easily putting her right and left hips through full range of motion with no difficulties at all with motion.  She does have pain to palpation of the trochanteric area on the right operative side.  Otherwise she looks great.  From my standpoint I do not need to see her back for 6 months unless there is any issues.  At that visit we will have [place] standing low AP pelvis and a lateral of both hips.  She is dealing with arthritis in her left hip.

## 2018-11-03 ENCOUNTER — Ambulatory Visit: Payer: Self-pay | Admitting: Orthopaedic Surgery

## 2018-11-15 ENCOUNTER — Encounter: Payer: Self-pay | Admitting: Orthopaedic Surgery

## 2018-11-15 ENCOUNTER — Ambulatory Visit (INDEPENDENT_AMBULATORY_CARE_PROVIDER_SITE_OTHER): Payer: Medicare Other | Admitting: Orthopaedic Surgery

## 2018-11-15 ENCOUNTER — Ambulatory Visit (INDEPENDENT_AMBULATORY_CARE_PROVIDER_SITE_OTHER): Payer: Medicare Other

## 2018-11-15 ENCOUNTER — Other Ambulatory Visit: Payer: Self-pay

## 2018-11-15 DIAGNOSIS — Z96641 Presence of right artificial hip joint: Secondary | ICD-10-CM

## 2018-11-15 DIAGNOSIS — M1612 Unilateral primary osteoarthritis, left hip: Secondary | ICD-10-CM

## 2018-11-15 DIAGNOSIS — M25552 Pain in left hip: Secondary | ICD-10-CM

## 2018-11-15 NOTE — Progress Notes (Signed)
Office Visit Note   Patient: Brittany Nielsen           Date of Birth: Feb 02, 1950           MRN: [id number] Visit Date: 11/15/2018              Requested by: No referring provider defined for this encounter. PCP: System, Pcp Not In   Assessment & Plan: Visit Diagnoses:  1. Status post total replacement of right hip   2. Pain in left hip   3. Unilateral primary osteoarthritis, left hip     Plan: At this point I do feel there is appropriate to set her up for a left total hip arthroplasty.  Having had this done before on the right side she is fully aware of the risk and benefits of surgery as well as what her intraoperative and postoperative course will involve.  I feel that she is appropriate candidate for this now based off her plain films and clinical exam findings showing severe arthritis in her left hip.  All question concerns were answered and addressed.  We will work on getting a left total hip arthroplasty scheduled for her.  Follow-Up Instructions: Return for 2 weeks post-op.   Orders:  Orders Placed This Encounter  Procedures  . XR HIPS BILAT W OR W/O PELVIS 3-4 VIEWS   No orders of the defined types were placed in this encounter.     Procedures: No procedures performed   Clinical Data: No additional findings.   Subjective: Chief Complaint  Patient presents with  . Right Hip - Follow-up  The patient is a very pleasant 68 year old female well-known to me.  She is 9 months out from a right total hip arthroplasty.  She has known and well-documented evidence of significant arthritis in her left hip.  She feels like she has gotten over her right hip enough that she wants to have her left hip replaced.  I agree with this as well given the arthritis present on x-rays in the left hip and her clinical exam.  Her left hip pain is become daily.  It is detrimentally affecting her mobility, her quality of life and her activities daily living.  She is diabetic but  reports excellent control.  She is always had a get hemoglobin A1c.  She again states that her right operative hip is done well.  HPI  Review of Systems She currently denies any headache, chest pain, shortness of breath, fever, chills, nausea, vomiting  Objective: Vital Signs: There were no vitals taken for this visit.  Physical Exam She is alert and orient x3 and in no acute distress Ortho Exam Examination of her right operative hip shows that it moves fully and fluidly with no pain at all.  Incisions healed nicely.  Her leg lengths are equal.  Examination of her left hip shows severe pain with internal and external rotation. Specialty Comments:  No specialty comments available.  Imaging: Xr Hips Bilat W Or W/o Pelvis 3-4 Views  Result Date: 11/15/2018 A low AP pelvis and lateral of both hips shows a well-seated right total hip arthroplasty with no complicating features.  The left hip has significant joint space narrowing as well as sclerotic changes in the femoral head and neck.  The osteoarthritis is severe with the left hip.  There are small periarticular osteophytes.    PMFS History: Patient Active Problem List   Diagnosis Date Noted  . Status post total replacement of right hip 02/26/2018  .  Unilateral primary osteoarthritis, left hip 01/25/2018  . Unilateral primary osteoarthritis, right hip 01/25/2018   Past Medical History:  Diagnosis Date  . Arthritis   . Elevated cholesterol   . Family history of adverse reaction to anesthesia    MOTHER NAUSEA   . Hx of bronchitis    I HAD IT BACK IN SEPTEMBER 2019 AND I USUALLY HAVE ONCE A YEAR   . Hx of hypokalemia   . Hypertension   . Panic attacks   . Sleep apnea    CPAP     History reviewed. No pertinent family history.  Past Surgical History:  Procedure Laterality Date  . CHOLECYSTECTOMY    . TOTAL HIP ARTHROPLASTY Right 02/26/2018   Procedure: RIGHT TOTAL HIP ARTHROPLASTY ANTERIOR APPROACH;  Surgeon: Kathryne Hitch, MD;  Location: [facility];  Service: Orthopedics;  Laterality: Right;  . TUBAL LIGATION     Social History   Occupational History  . Not on file  Tobacco Use  . Smoking status: Never Smoker  . Smokeless tobacco: Never Used  Substance and Sexual Activity  . Alcohol use: Not on file  . Drug use: Never  . Sexual activity: Not on file

## 2018-11-26 ENCOUNTER — Other Ambulatory Visit: Payer: Self-pay

## 2018-12-02 ENCOUNTER — Other Ambulatory Visit: Payer: Self-pay | Admitting: Physician Assistant

## 2018-12-03 NOTE — Patient Instructions (Addendum)
DUE TO COVID-19 ONLY ONE VISITOR IS ALLOWED TO COME WITH YOU AND STAY IN THE WAITING ROOM ONLY DURING PRE OP AND PROCEDURE DAY OF SURGERY. THE 1 VISITOR MAY VISIT WITH YOU AFTER SURGERY IN YOUR PRIVATE ROOM DURING VISITING HOURS ONLY!  YOU NEED TO HAVE A COVID 19 TEST ON_11/24______ @_2 :15 pm______, THIS TEST MUST BE DONE BEFORE SURGERY, COME  [address], [place] , [phone].  [facility]) ONCE YOUR COVID TEST IS COMPLETED, PLEASE BEGIN THE QUARANTINE INSTRUCTIONS AS OUTLINED IN YOUR HANDOUT.                Brittany Nielsen    Your procedure is scheduled on: 12/10/18   [facility]   Report to admitting at   7:15 AM     Call this number if you have problems the morning of surgery [phone]    . BRUSH YOUR TEETH MORNING OF SURGERY AND RINSE YOUR MOUTH OUT, NO CHEWING GUM CANDY OR MINTS.   Do not eat food After Midnight.   YOU MAY HAVE CLEAR LIQUIDS FROM MIDNIGHT UNTIL 6:45 AM  . At 6:45 AM Please finish the prescribed Pre-Surgery Gatorade drink.   Nothing by mouth after you finish the Gatorade drink !   Take these medicines the morning of surgery with A SIP OF WATER: amlodipine Bring mask and tubing to hospital with you  DO NOT TAKE ANY DIABETIC MEDICATIONS DAY OF YOUR SURGERY                    How to Manage Your Diabetes Before and After Surgery  Why is it important to control my blood sugar before and after surgery? . Improving blood sugar levels before and after surgery helps healing and can limit problems. . A way of improving blood sugar control is eating a healthy diet by: o  Eating less sugar and carbohydrates o  Increasing activity/exercise o  Talking with your doctor about reaching your blood sugar goals . High blood sugars (greater than 180 mg/dL) can raise your risk of infections and slow your recovery, so you will need to focus on controlling your diabetes during the weeks before surgery. . Make  sure that the doctor who takes care of your diabetes knows about your planned surgery including the date and location.  How do I manage my blood sugar before surgery? . Check your blood sugar at least 4 times a day, starting 2 days before surgery, to make sure that the level is not too high or low. o Check your blood sugar the morning of your surgery when you wake up and every 2 hours until you get to the Short Stay unit. . If your blood sugar is less than 70 mg/dL, you will need to treat for low blood sugar: o Do not take insulin. o Treat a low blood sugar (less than 70 mg/dL) with  cup of clear juice (cranberry or apple), 4 glucose tablets, OR glucose gel. o Recheck blood sugar in 15 minutes after treatment (to make sure it is greater than 70 mg/dL). If your blood sugar is not greater than 70 mg/dL on recheck, call [phone] for further instructions. . Report your blood sugar to the short stay nurse when you get to Short Stay.  . If you are admitted to the hospital after surgery: o Your blood sugar will be checked by the staff and you will probably be given insulin after surgery (instead  of oral diabetes medicines) to make sure you have good blood sugar levels. o The goal for blood sugar control after surgery is 80-180 mg/dL.   WHAT DO I DO ABOUT MY DIABETES MEDICATION?             You may not have any metal on your body including hair pins and              piercings  Do not wear jewelry, make-up, lotions, powders or perfumes, deodorant             Do not wear nail polish on your fingernails.  Do not shave  48 hours prior to surgery.         Do not bring valuables to the hospital. [facility] IS NOT             RESPONSIBLE   FOR VALUABLES.  Contacts, dentures or bridgework may not be worn into surgery.      Patients discharged the day of surgery will not be allowed to drive home.  IF YOU ARE HAVING SURGERY AND GOING HOME THE SAME DAY, YOU MUST HAVE AN ADULT TO DRIVE YOU HOME  AND BE WITH YOU FOR 24 HOURS.  YOU MAY GO HOME BY TAXI OR UBER OR ORTHERWISE, BUT AN ADULT MUST ACCOMPANY YOU HOME AND STAY WITH YOU FOR 24 HOURS.  Name and phone number of your driver:  Special Instructions: N/A              Please read over the following fact sheets you were given: _____________________________________________________________________             [facility] - Preparing for Surgery  Before surgery, you can play an important role.   Because skin is not sterile, your skin needs to be as free of germs as possible.   You can reduce the number of germs on your skin by washing with CHG (chlorahexidine gluconate) soap before surgery.   CHG is an antiseptic cleaner which kills germs and bonds with the skin to continue killing germs even after washing. Please DO NOT use if you have an allergy to CHG or antibacterial soaps .  If your skin becomes reddened/irritated stop using the CHG and inform your nurse when you arrive at Short Stay. Do not shave (including legs and underarms) for at least 48 hours prior to the first CHG shower.   . Please follow these instructions carefully:  1.  Shower with CHG Soap the night before surgery and the  morning of Surgery.  2.  If you choose to wash your hair, wash your hair first as usual with your  normal  shampoo.  3.  After you shampoo, rinse your hair and body thoroughly to remove the  shampoo.                                        4.  Use CHG as you would any other liquid soap.  You can apply chg directly  to the skin and wash                       Gently with a scrungie or clean washcloth.  5.  Apply the CHG Soap to your body ONLY FROM THE NECK DOWN.   Do not use on face/ open  Wound or open sores. Avoid contact with eyes, ears mouth and genitals (private parts).                       Wash face,  Genitals (private parts) with your normal soap.             6.  Wash thoroughly, paying special attention to the area  where your surgery  will be performed.  7.  Thoroughly rinse your body with warm water from the neck down.  8.  DO NOT shower/wash with your normal soap after using and rinsing off  the CHG Soap.             9.  Pat yourself dry with a clean towel.            10.  Wear clean pajamas.            11.  Place clean sheets on your bed the night of your first shower and do not  sleep with pets. Day of Surgery : Do not apply any lotions/deodorants the morning of surgery.  Please wear clean clothes to the hospital/surgery center.  FAILURE TO FOLLOW THESE INSTRUCTIONS MAY RESULT IN THE CANCELLATION OF YOUR SURGERY PATIENT SIGNATURE_________________________________  NURSE SIGNATURE__________________________________  ________________________________________________________________________   [name]  An incentive spirometer is a tool that can help keep your lungs clear and active. This tool measures how well you are filling your lungs with each breath. Taking long deep breaths may help reverse or decrease the chance of developing breathing (pulmonary) problems (especially infection) following:  A long period of time when you are unable to move or be active. BEFORE THE PROCEDURE   If the spirometer includes an indicator to show your best effort, your nurse or respiratory therapist will set it to a desired goal.  If possible, sit up straight or lean slightly forward. Try not to slouch.  Hold the incentive spirometer in an upright position. INSTRUCTIONS FOR USE  1. Sit on the edge of your bed if possible, or sit up as far as you can in bed or on a chair. 2. Hold the incentive spirometer in an upright position. 3. Breathe out normally. 4. Place the mouthpiece in your mouth and seal your lips tightly around it. 5. Breathe in slowly and as deeply as possible, raising the piston or the ball toward the top of the column. 6. Hold your breath for 3-5 seconds or for as long as possible.  Allow the piston or ball to fall to the bottom of the column. 7. Remove the mouthpiece from your mouth and breathe out normally. 8. Rest for a few seconds and repeat Steps 1 through 7 at least 10 times every 1-2 hours when you are awake. Take your time and take a few normal breaths between deep breaths. 9. The spirometer may include an indicator to show your best effort. Use the indicator as a goal to work toward during each repetition. 10. After each set of 10 deep breaths, practice coughing to be sure your lungs are clear. If you have an incision (the cut made at the time of surgery), support your incision when coughing by placing a pillow or rolled up towels firmly against it. Once you are able to get out of bed, walk around indoors and cough well. You may stop using the incentive spirometer when instructed by your caregiver.  RISKS AND COMPLICATIONS  Take your time so you do not get dizzy or  light-headed.  If you are in pain, you may need to take or ask for pain medication before doing incentive spirometry. It is harder to take a deep breath if you are having pain. AFTER USE  Rest and breathe slowly and easily.  It can be helpful to keep track of a log of your progress. Your caregiver can provide you with a simple table to help with this. If you are using the spirometer at home, follow these instructions: SEEK MEDICAL CARE IF:   You are having difficultly using the spirometer.  You have trouble using the spirometer as often as instructed.  Your pain medication is not giving enough relief while using the spirometer.  You develop fever of 100.5 F (38.1 C) or higher. SEEK IMMEDIATE MEDICAL CARE IF:   You cough up bloody sputum that had not been present before.  You develop fever of 102 F (38.9 C) or greater.  You develop worsening pain at or near the incision site. MAKE SURE YOU:   Understand these instructions.  Will watch your condition.  Will get help right away if you  are not doing well or get worse. Document Released: 05/12/2006 Document Revised: 03/24/2011 Document Reviewed: 07/13/2006 [facility] Patient Information 2014 [place], [place].   ________________________________________________________________________

## 2018-12-06 ENCOUNTER — Encounter (HOSPITAL_COMMUNITY): Payer: Self-pay

## 2018-12-06 ENCOUNTER — Other Ambulatory Visit: Payer: Self-pay

## 2018-12-06 ENCOUNTER — Encounter (HOSPITAL_COMMUNITY)
Admission: RE | Admit: 2018-12-06 | Discharge: 2018-12-06 | Disposition: A | Discharge status: Home / Self Care | Payer: Medicare Other | Source: Ambulatory Visit | Attending: Orthopaedic Surgery | Admitting: Orthopaedic Surgery

## 2018-12-06 DIAGNOSIS — E119 Type 2 diabetes mellitus without complications: Secondary | ICD-10-CM | POA: Insufficient documentation

## 2018-12-06 DIAGNOSIS — Z01810 Encounter for preprocedural cardiovascular examination: Secondary | ICD-10-CM | POA: Diagnosis present

## 2018-12-06 DIAGNOSIS — Z01812 Encounter for preprocedural laboratory examination: Secondary | ICD-10-CM | POA: Insufficient documentation

## 2018-12-06 HISTORY — DX: Type 2 diabetes mellitus without complications: E11.9

## 2018-12-06 LAB — CBC
HCT: 44 % (ref 36.0–46.0)
Hemoglobin: 13.6 g/dL (ref 12.0–15.0)
MCH: 27.5 pg (ref 26.0–34.0)
MCHC: 30.9 g/dL (ref 30.0–36.0)
MCV: 88.9 fL (ref 80.0–100.0)
Platelets: 280 10*3/uL (ref 150–400)
RBC: 4.95 MIL/uL (ref 3.87–5.11)
RDW: 14.1 % (ref 11.5–15.5)
WBC: 8.2 10*3/uL (ref 4.0–10.5)
nRBC: 0 % (ref 0.0–0.2)

## 2018-12-06 LAB — BASIC METABOLIC PANEL
Anion gap: 9 (ref 5–15)
BUN: 18 mg/dL (ref 8–23)
CO2: 27 mmol/L (ref 22–32)
Calcium: 9.4 mg/dL (ref 8.9–10.3)
Chloride: 103 mmol/L (ref 98–111)
Creatinine, Ser: 0.86 mg/dL (ref 0.44–1.00)
GFR calc Af Amer: >60 mL/min (ref >60)
GFR calc non Af Amer: >60 mL/min (ref >60)
Glucose, Bld: 146 mg/dL — ABNORMAL HIGH (ref 70–99)
Potassium: 4.1 mmol/L (ref 3.5–5.1)
Sodium: 139 mmol/L (ref 135–145)

## 2018-12-06 LAB — GLUCOSE, CAPILLARY: Glucose-Capillary: 137 mg/dL — ABNORMAL HIGH (ref 70–99)

## 2018-12-06 LAB — HEMOGLOBIN A1C
Hgb A1c MFr Bld: 6 % — ABNORMAL HIGH (ref 4.8–5.6)
Mean Plasma Glucose: 125.5 mg/dL

## 2018-12-06 LAB — SURGICAL PCR SCREEN
MRSA, PCR: NEGATIVE
Staphylococcus aureus: POSITIVE — AB

## 2018-12-06 NOTE — Progress Notes (Addendum)
PCP - Kara Dies Cardiologist - no  Chest x-ray - 02/26/18 EKG - 02/16/18 Stress Test - no ECHO - no Cardiac Cath - no  Sleep Study - yes CPAP - yes  Fasting Blood Sugar - Pt doesn't test. She was put on glipizide because hgbA1c was 6.5 so Dr. Amedeo Plenty put her on meds to have a f/u visit in 6 months. Checks Blood Sugar 0_____ times a day  Blood Thinner Instructions:Plavix and ASA Aspirin Instructions:No one gave instructions about stopping  Plavix or ASA. Pt was told to call Dr. Trevor Mace office for pre op instructions  and call her PCP to verify.  She takes blood thinners for high colesterol Last Dose:12/05/18  Anesthesia review:   Patient denies shortness of breath, fever, cough and chest pain at PAT appointment  yes Patient verbalized understanding of instructions that were given to them at the PAT appointment. Patient was also instructed that they will need to review over the PAT instructions again at home before surgery. Yes.

## 2018-12-07 ENCOUNTER — Other Ambulatory Visit (HOSPITAL_COMMUNITY)
Admission: RE | Admit: 2018-12-07 | Discharge: 2018-12-07 | Disposition: A | Discharge status: Home / Self Care | Payer: Medicare Other | Source: Ambulatory Visit | Attending: Orthopaedic Surgery | Admitting: Orthopaedic Surgery

## 2018-12-07 DIAGNOSIS — Z01812 Encounter for preprocedural laboratory examination: Secondary | ICD-10-CM | POA: Insufficient documentation

## 2018-12-07 DIAGNOSIS — Z20828 Contact with and (suspected) exposure to other viral communicable diseases: Secondary | ICD-10-CM | POA: Insufficient documentation

## 2018-12-07 LAB — SARS CORONAVIRUS 2 (TAT 6-24 HRS): SARS Coronavirus 2: NEGATIVE

## 2018-12-07 NOTE — Progress Notes (Addendum)
Verified that Pt that she does not take Plavix. She only takes ASA 81mg .  There was an error in charting on progress note on 12/06/18.

## 2018-12-09 MED ORDER — DEXTROSE 5 % IV SOLN
3.0000 g | INTRAVENOUS | Status: AC
  Administered 2018-12-10: 09:00:00 3 g via INTRAVENOUS
  Filled 2018-12-09: qty 3

## 2018-12-10 ENCOUNTER — Encounter (HOSPITAL_COMMUNITY)
Admission: RE | Disposition: A | Discharge status: Home Health | Payer: Self-pay | Source: Home / Self Care | Attending: Orthopaedic Surgery

## 2018-12-10 ENCOUNTER — Ambulatory Visit (HOSPITAL_COMMUNITY): Payer: Medicare Other

## 2018-12-10 ENCOUNTER — Ambulatory Visit (HOSPITAL_COMMUNITY): Payer: Medicare Other | Admitting: Anesthesiology

## 2018-12-10 ENCOUNTER — Observation Stay (HOSPITAL_COMMUNITY)
Admission: RE | Admit: 2018-12-10 | Discharge: 2018-12-11 | Disposition: A | Discharge status: Home Health | Payer: Medicare Other | Attending: Orthopaedic Surgery | Admitting: Orthopaedic Surgery

## 2018-12-10 ENCOUNTER — Encounter (HOSPITAL_COMMUNITY): Payer: Self-pay | Admitting: Anesthesiology

## 2018-12-10 ENCOUNTER — Ambulatory Visit (HOSPITAL_COMMUNITY): Payer: Medicare Other | Admitting: Physician Assistant

## 2018-12-10 ENCOUNTER — Other Ambulatory Visit: Payer: Self-pay

## 2018-12-10 ENCOUNTER — Observation Stay (HOSPITAL_COMMUNITY): Payer: Medicare Other

## 2018-12-10 DIAGNOSIS — Z96641 Presence of right artificial hip joint: Secondary | ICD-10-CM | POA: Insufficient documentation

## 2018-12-10 DIAGNOSIS — Z419 Encounter for procedure for purposes other than remedying health state, unspecified: Secondary | ICD-10-CM

## 2018-12-10 DIAGNOSIS — M1612 Unilateral primary osteoarthritis, left hip: Secondary | ICD-10-CM | POA: Diagnosis not present

## 2018-12-10 DIAGNOSIS — Z791 Long term (current) use of non-steroidal anti-inflammatories (NSAID): Secondary | ICD-10-CM | POA: Diagnosis not present

## 2018-12-10 DIAGNOSIS — G4730 Sleep apnea, unspecified: Secondary | ICD-10-CM | POA: Insufficient documentation

## 2018-12-10 DIAGNOSIS — I10 Essential (primary) hypertension: Secondary | ICD-10-CM | POA: Insufficient documentation

## 2018-12-10 DIAGNOSIS — E7800 Pure hypercholesterolemia, unspecified: Secondary | ICD-10-CM | POA: Insufficient documentation

## 2018-12-10 DIAGNOSIS — Z7982 Long term (current) use of aspirin: Secondary | ICD-10-CM | POA: Insufficient documentation

## 2018-12-10 DIAGNOSIS — Z96642 Presence of left artificial hip joint: Secondary | ICD-10-CM

## 2018-12-10 DIAGNOSIS — Z79899 Other long term (current) drug therapy: Secondary | ICD-10-CM | POA: Diagnosis not present

## 2018-12-10 DIAGNOSIS — E119 Type 2 diabetes mellitus without complications: Secondary | ICD-10-CM | POA: Diagnosis not present

## 2018-12-10 DIAGNOSIS — Z794 Long term (current) use of insulin: Secondary | ICD-10-CM | POA: Diagnosis not present

## 2018-12-10 DIAGNOSIS — Z9049 Acquired absence of other specified parts of digestive tract: Secondary | ICD-10-CM | POA: Diagnosis not present

## 2018-12-10 DIAGNOSIS — Z6841 Body Mass Index (BMI) 40.0 and over, adult: Secondary | ICD-10-CM | POA: Insufficient documentation

## 2018-12-10 HISTORY — PX: TOTAL HIP ARTHROPLASTY: SHX124

## 2018-12-10 LAB — GLUCOSE, CAPILLARY
Glucose-Capillary: 148 mg/dL — ABNORMAL HIGH (ref 70–99)
Glucose-Capillary: 154 mg/dL — ABNORMAL HIGH (ref 70–99)
Glucose-Capillary: 158 mg/dL — ABNORMAL HIGH (ref 70–99)
Glucose-Capillary: 195 mg/dL — ABNORMAL HIGH (ref 70–99)
Glucose-Capillary: 221 mg/dL — ABNORMAL HIGH (ref 70–99)

## 2018-12-10 SURGERY — ARTHROPLASTY, HIP, TOTAL, ANTERIOR APPROACH
Anesthesia: Spinal | Site: Hip | Laterality: Left

## 2018-12-10 MED ORDER — POTASSIUM CHLORIDE CRYS ER 20 MEQ PO TBCR
20.0000 meq | EXTENDED_RELEASE_TABLET | Freq: Two times a day (BID) | ORAL | Status: DC
  Administered 2018-12-10 – 2018-12-11 (×2): 20 meq via ORAL
  Filled 2018-12-10 (×2): qty 1

## 2018-12-10 MED ORDER — CEFAZOLIN SODIUM-DEXTROSE 2-4 GM/100ML-% IV SOLN
2.0000 g | Freq: Four times a day (QID) | INTRAVENOUS | Status: AC
  Administered 2018-12-10 (×2): 2 g via INTRAVENOUS
  Filled 2018-12-10 (×2): qty 100

## 2018-12-10 MED ORDER — FENTANYL CITRATE (PF) 100 MCG/2ML IJ SOLN
INTRAMUSCULAR | Status: DC | PRN
  Administered 2018-12-10 (×2): 50 ug via INTRAVENOUS

## 2018-12-10 MED ORDER — DEXAMETHASONE SODIUM PHOSPHATE 10 MG/ML IJ SOLN
INTRAMUSCULAR | Status: DC | PRN
  Administered 2018-12-10: 10 mg via INTRAVENOUS

## 2018-12-10 MED ORDER — OXYCODONE HCL 5 MG PO TABS
5.0000 mg | ORAL_TABLET | ORAL | Status: DC | PRN
  Administered 2018-12-11 (×2): 10 mg via ORAL
  Filled 2018-12-10 (×2): qty 2
  Filled 2018-12-10: qty 1

## 2018-12-10 MED ORDER — INSULIN ASPART 100 UNIT/ML ~~LOC~~ SOLN
0.0000 [IU] | Freq: Every day | SUBCUTANEOUS | Status: DC
  Administered 2018-12-10: 2 [IU] via SUBCUTANEOUS

## 2018-12-10 MED ORDER — ALUM & MAG HYDROXIDE-SIMETH 200-200-20 MG/5ML PO SUSP: 30.0000 mL | ORAL | Status: DC | PRN

## 2018-12-10 MED ORDER — DEXAMETHASONE SODIUM PHOSPHATE 10 MG/ML IJ SOLN
INTRAMUSCULAR | Status: AC
  Filled 2018-12-10: qty 1

## 2018-12-10 MED ORDER — MIDAZOLAM HCL 5 MG/5ML IJ SOLN
INTRAMUSCULAR | Status: DC | PRN
  Administered 2018-12-10: 2 mg via INTRAVENOUS

## 2018-12-10 MED ORDER — INSULIN ASPART 100 UNIT/ML ~~LOC~~ SOLN
0.0000 [IU] | Freq: Three times a day (TID) | SUBCUTANEOUS | Status: DC
  Administered 2018-12-10 – 2018-12-11 (×3): 4 [IU] via SUBCUTANEOUS

## 2018-12-10 MED ORDER — MIDAZOLAM HCL 2 MG/2ML IJ SOLN
INTRAMUSCULAR | Status: AC
  Filled 2018-12-10: qty 2

## 2018-12-10 MED ORDER — METOCLOPRAMIDE HCL 5 MG/ML IJ SOLN
5.0000 mg | Freq: Three times a day (TID) | INTRAMUSCULAR | Status: DC | PRN
  Administered 2018-12-11: 10 mg via INTRAVENOUS
  Filled 2018-12-10: qty 2

## 2018-12-10 MED ORDER — POVIDONE-IODINE 10 % EX SWAB
2.0000 "application " | Freq: Once | CUTANEOUS | Status: AC
  Administered 2018-12-10: 2 via TOPICAL

## 2018-12-10 MED ORDER — MENTHOL 3 MG MT LOZG: 1.0000 | LOZENGE | OROMUCOSAL | Status: DC | PRN

## 2018-12-10 MED ORDER — METHOCARBAMOL 500 MG PO TABS
500.0000 mg | ORAL_TABLET | Freq: Four times a day (QID) | ORAL | Status: DC | PRN
  Administered 2018-12-11 (×2): 500 mg via ORAL
  Filled 2018-12-10 (×2): qty 1

## 2018-12-10 MED ORDER — DIPHENHYDRAMINE HCL 12.5 MG/5ML PO ELIX: 12.5000 mg | ORAL_SOLUTION | ORAL | Status: DC | PRN

## 2018-12-10 MED ORDER — PROPOFOL 10 MG/ML IV BOLUS
INTRAVENOUS | Status: DC | PRN
  Administered 2018-12-10 (×2): 20 mg via INTRAVENOUS

## 2018-12-10 MED ORDER — PHENYLEPHRINE HCL (PRESSORS) 10 MG/ML IV SOLN
INTRAVENOUS | Status: AC
  Filled 2018-12-10: qty 1

## 2018-12-10 MED ORDER — DOCUSATE SODIUM 100 MG PO CAPS
100.0000 mg | ORAL_CAPSULE | Freq: Two times a day (BID) | ORAL | Status: DC
  Administered 2018-12-10 – 2018-12-11 (×3): 100 mg via ORAL
  Filled 2018-12-10 (×3): qty 1

## 2018-12-10 MED ORDER — PROPOFOL 10 MG/ML IV BOLUS
INTRAVENOUS | Status: AC
  Filled 2018-12-10: qty 20

## 2018-12-10 MED ORDER — PHENOL 1.4 % MT LIQD: 1.0000 | OROMUCOSAL | Status: DC | PRN

## 2018-12-10 MED ORDER — ACETAMINOPHEN 325 MG PO TABS: 325.0000 mg | ORAL_TABLET | Freq: Four times a day (QID) | ORAL | Status: DC | PRN

## 2018-12-10 MED ORDER — 0.9 % SODIUM CHLORIDE (POUR BTL) OPTIME
TOPICAL | Status: DC | PRN
  Administered 2018-12-10: 1000 mL

## 2018-12-10 MED ORDER — GLIPIZIDE 5 MG PO TABS
5.0000 mg | ORAL_TABLET | Freq: Every day | ORAL | Status: DC
  Administered 2018-12-11: 5 mg via ORAL
  Filled 2018-12-10: qty 1

## 2018-12-10 MED ORDER — TRANEXAMIC ACID-NACL 1000-0.7 MG/100ML-% IV SOLN
1000.0000 mg | INTRAVENOUS | Status: AC
  Administered 2018-12-10: 09:00:00 1000 mg via INTRAVENOUS
  Filled 2018-12-10: qty 100

## 2018-12-10 MED ORDER — PHENYLEPHRINE 40 MCG/ML (10ML) SYRINGE FOR IV PUSH (FOR BLOOD PRESSURE SUPPORT)
PREFILLED_SYRINGE | INTRAVENOUS | Status: DC | PRN
  Administered 2018-12-10 (×2): 80 ug via INTRAVENOUS

## 2018-12-10 MED ORDER — STERILE WATER FOR IRRIGATION IR SOLN
Status: DC | PRN
  Administered 2018-12-10: 2000 mL

## 2018-12-10 MED ORDER — PROPOFOL 500 MG/50ML IV EMUL
INTRAVENOUS | Status: DC | PRN
  Administered 2018-12-10: 50 ug/kg/min via INTRAVENOUS

## 2018-12-10 MED ORDER — ACETAMINOPHEN 500 MG PO TABS
1000.0000 mg | ORAL_TABLET | Freq: Once | ORAL | Status: AC
  Administered 2018-12-10: 1000 mg via ORAL
  Filled 2018-12-10: qty 2

## 2018-12-10 MED ORDER — ONDANSETRON HCL 4 MG/2ML IJ SOLN
INTRAMUSCULAR | Status: AC
  Filled 2018-12-10: qty 2

## 2018-12-10 MED ORDER — METHOCARBAMOL 500 MG IVPB - SIMPLE MED
500.0000 mg | Freq: Four times a day (QID) | INTRAVENOUS | Status: DC | PRN
  Administered 2018-12-10 (×2): 500 mg via INTRAVENOUS
  Filled 2018-12-10 (×2): qty 500

## 2018-12-10 MED ORDER — POLYETHYLENE GLYCOL 3350 17 G PO PACK: 17.0000 g | PACK | Freq: Every day | ORAL | Status: DC | PRN

## 2018-12-10 MED ORDER — ROSUVASTATIN CALCIUM 10 MG PO TABS
10.0000 mg | ORAL_TABLET | Freq: Every day | ORAL | Status: DC
  Administered 2018-12-10: 10 mg via ORAL
  Filled 2018-12-10: qty 1

## 2018-12-10 MED ORDER — PANTOPRAZOLE SODIUM 40 MG PO TBEC
40.0000 mg | DELAYED_RELEASE_TABLET | Freq: Every day | ORAL | Status: DC
  Administered 2018-12-10 – 2018-12-11 (×2): 40 mg via ORAL
  Filled 2018-12-10 (×2): qty 1

## 2018-12-10 MED ORDER — FENTANYL CITRATE (PF) 100 MCG/2ML IJ SOLN: 25.0000 ug | INTRAMUSCULAR | Status: DC | PRN

## 2018-12-10 MED ORDER — PHENYLEPHRINE 40 MCG/ML (10ML) SYRINGE FOR IV PUSH (FOR BLOOD PRESSURE SUPPORT)
PREFILLED_SYRINGE | INTRAVENOUS | Status: AC
  Filled 2018-12-10: qty 10

## 2018-12-10 MED ORDER — FENTANYL CITRATE (PF) 100 MCG/2ML IJ SOLN
INTRAMUSCULAR | Status: AC
  Filled 2018-12-10: qty 2

## 2018-12-10 MED ORDER — AMLODIPINE BESYLATE 10 MG PO TABS
10.0000 mg | ORAL_TABLET | Freq: Every morning | ORAL | Status: DC
  Administered 2018-12-11: 10:00:00 10 mg via ORAL
  Filled 2018-12-10: qty 1

## 2018-12-10 MED ORDER — OXYCODONE HCL 5 MG PO TABS
10.0000 mg | ORAL_TABLET | ORAL | Status: DC | PRN
  Administered 2018-12-10 (×2): 15 mg via ORAL
  Filled 2018-12-10 (×2): qty 3

## 2018-12-10 MED ORDER — ONDANSETRON HCL 4 MG/2ML IJ SOLN: 4.0000 mg | Freq: Once | INTRAMUSCULAR | Status: DC | PRN

## 2018-12-10 MED ORDER — PHENYLEPHRINE HCL-NACL 10-0.9 MG/250ML-% IV SOLN
INTRAVENOUS | Status: DC | PRN
  Administered 2018-12-10: 20 ug/min via INTRAVENOUS

## 2018-12-10 MED ORDER — SODIUM CHLORIDE 0.9 % IV SOLN
INTRAVENOUS | Status: DC
  Administered 2018-12-10: 1000 mL via INTRAVENOUS

## 2018-12-10 MED ORDER — HYDROMORPHONE HCL 1 MG/ML IJ SOLN
0.5000 mg | INTRAMUSCULAR | Status: DC | PRN
  Administered 2018-12-10: 1 mg via INTRAVENOUS
  Filled 2018-12-10: qty 1

## 2018-12-10 MED ORDER — PAROXETINE HCL 20 MG PO TABS
40.0000 mg | ORAL_TABLET | Freq: Every day | ORAL | Status: DC
  Administered 2018-12-10: 40 mg via ORAL
  Filled 2018-12-10: qty 2

## 2018-12-10 MED ORDER — ONDANSETRON HCL 4 MG/2ML IJ SOLN
4.0000 mg | Freq: Four times a day (QID) | INTRAMUSCULAR | Status: DC | PRN
  Administered 2018-12-10 – 2018-12-11 (×2): 4 mg via INTRAVENOUS
  Filled 2018-12-10 (×2): qty 2

## 2018-12-10 MED ORDER — ONDANSETRON HCL 4 MG PO TABS: 4.0000 mg | ORAL_TABLET | Freq: Four times a day (QID) | ORAL | Status: DC | PRN

## 2018-12-10 MED ORDER — METOCLOPRAMIDE HCL 5 MG PO TABS: 5.0000 mg | ORAL_TABLET | Freq: Three times a day (TID) | ORAL | Status: DC | PRN

## 2018-12-10 MED ORDER — BUPIVACAINE IN DEXTROSE 0.75-8.25 % IT SOLN
INTRATHECAL | Status: DC | PRN
  Administered 2018-12-10: 1.8 mL via INTRATHECAL

## 2018-12-10 MED ORDER — ASPIRIN 81 MG PO CHEW
81.0000 mg | CHEWABLE_TABLET | Freq: Two times a day (BID) | ORAL | Status: DC
  Administered 2018-12-10 – 2018-12-11 (×2): 81 mg via ORAL
  Filled 2018-12-10 (×2): qty 1

## 2018-12-10 MED ORDER — LACTATED RINGERS IV SOLN
INTRAVENOUS | Status: DC
  Administered 2018-12-10 (×2): via INTRAVENOUS

## 2018-12-10 MED ORDER — ONDANSETRON HCL 4 MG/2ML IJ SOLN
INTRAMUSCULAR | Status: DC | PRN
  Administered 2018-12-10: 4 mg via INTRAVENOUS

## 2018-12-10 MED ORDER — CHLORHEXIDINE GLUCONATE 4 % EX LIQD: 60.0000 mL | Freq: Once | CUTANEOUS | Status: DC

## 2018-12-10 MED ORDER — SODIUM CHLORIDE 0.9 % IR SOLN
Status: DC | PRN
  Administered 2018-12-10: 1000 mL

## 2018-12-10 MED ORDER — GLIPIZIDE 5 MG PO TABS: 5.0000 mg | ORAL_TABLET | Freq: Every day | ORAL | Status: DC

## 2018-12-10 MED ORDER — PROPOFOL 500 MG/50ML IV EMUL
INTRAVENOUS | Status: AC
  Filled 2018-12-10: qty 50

## 2018-12-10 SURGICAL SUPPLY — 44 items
BAG ZIPLOCK 12X15 (MISCELLANEOUS) IMPLANT
BENZOIN TINCTURE PRP APPL 2/3 (GAUZE/BANDAGES/DRESSINGS) IMPLANT
BLADE SAW SGTL 18X1.27X75 (BLADE) ×2 IMPLANT
BLADE SAW SGTL 18X1.27X75MM (BLADE) ×1
CLOSURE WOUND 1/2 X4 (GAUZE/BANDAGES/DRESSINGS)
COVER PERINEAL POST (MISCELLANEOUS) ×3 IMPLANT
COVER SURGICAL LIGHT HANDLE (MISCELLANEOUS) ×3 IMPLANT
COVER WAND RF STERILE (DRAPES) ×3 IMPLANT
DRAPE STERI IOBAN 125X83 (DRAPES) ×3 IMPLANT
DRAPE U-SHAPE 47X51 STRL (DRAPES) ×6 IMPLANT
DRESSING AQUACEL AG SP 3.5X10 (GAUZE/BANDAGES/DRESSINGS) ×1 IMPLANT
DRSG AQUACEL AG ADV 3.5X10 (GAUZE/BANDAGES/DRESSINGS) ×3 IMPLANT
DRSG AQUACEL AG SP 3.5X10 (GAUZE/BANDAGES/DRESSINGS) ×3
DRSG CURAD 3X16 NADH (PACKING) ×3 IMPLANT
DURAPREP 26ML APPLICATOR (WOUND CARE) ×3 IMPLANT
ELECT REM PT RETURN 15FT ADLT (MISCELLANEOUS) ×3 IMPLANT
GAUZE XEROFORM 1X8 LF (GAUZE/BANDAGES/DRESSINGS) ×3 IMPLANT
GLOVE BIO SURGEON STRL SZ7.5 (GLOVE) ×3 IMPLANT
GLOVE BIOGEL PI IND STRL 8 (GLOVE) ×2 IMPLANT
GLOVE BIOGEL PI INDICATOR 8 (GLOVE) ×4
GLOVE ECLIPSE 8.0 STRL XLNG CF (GLOVE) ×3 IMPLANT
GOWN STRL REUS W/TWL XL LVL3 (GOWN DISPOSABLE) ×6 IMPLANT
HANDPIECE INTERPULSE COAX TIP (DISPOSABLE) ×2
HEAD M SROM 36MM 2 (Hips) ×1 IMPLANT
HOLDER FOLEY CATH W/STRAP (MISCELLANEOUS) ×3 IMPLANT
KIT TURNOVER KIT A (KITS) IMPLANT
LINER ACETAB NEUTRAL 36ID 520D (Liner) ×3 IMPLANT
PACK ANTERIOR HIP CUSTOM (KITS) ×3 IMPLANT
PENCIL SMOKE EVACUATOR (MISCELLANEOUS) IMPLANT
PIN SECTOR W/GRIP ACE CUP 52MM (Hips) ×3 IMPLANT
SET HNDPC FAN SPRY TIP SCT (DISPOSABLE) ×1 IMPLANT
SROM M HEAD 36MM 2 (Hips) ×3 IMPLANT
STAPLER VISISTAT 35W (STAPLE) ×3 IMPLANT
STEM CORAIL KA10 (Stem) ×3 IMPLANT
STRIP CLOSURE SKIN 1/2X4 (GAUZE/BANDAGES/DRESSINGS) IMPLANT
SUT ETHIBOND NAB CT1 #1 30IN (SUTURE) ×3 IMPLANT
SUT ETHILON 2 0 PS N (SUTURE) IMPLANT
SUT MNCRL AB 4-0 PS2 18 (SUTURE) IMPLANT
SUT VIC AB 0 CT1 36 (SUTURE) ×3 IMPLANT
SUT VIC AB 1 CT1 36 (SUTURE) ×3 IMPLANT
SUT VIC AB 2-0 CT1 27 (SUTURE) ×6
SUT VIC AB 2-0 CT1 TAPERPNT 27 (SUTURE) ×3 IMPLANT
TRAY FOLEY MTR SLVR 14FR STAT (SET/KITS/TRAYS/PACK) ×3 IMPLANT
YANKAUER SUCT BULB TIP 10FT TU (MISCELLANEOUS) ×3 IMPLANT

## 2018-12-10 NOTE — Anesthesia Postprocedure Evaluation (Signed)
Anesthesia Post Note  Patient: Amylia Kijuana Ruppel  Procedure(s) Performed: LEFT TOTAL HIP ARTHROPLASTY ANTERIOR APPROACH (Left Hip)     Patient location during evaluation: PACU Anesthesia Type: Spinal Level of consciousness: oriented, awake and alert and awake Pain management: pain level controlled Vital Signs Assessment: post-procedure vital signs reviewed and stable Respiratory status: spontaneous breathing, respiratory function stable and nonlabored ventilation Cardiovascular status: blood pressure returned to baseline and stable Postop Assessment: no headache, no backache and no apparent nausea or vomiting Anesthetic complications: no    Last Vitals:  Vitals:   12/10/18 1105 12/10/18 1115  BP:  123/65  Pulse:  77  Resp:  15  Temp:    SpO2: 100% 95%    Last Pain:  Vitals:   12/10/18 1100  TempSrc:   PainSc: 0-No pain                 Catalina Gravel

## 2018-12-10 NOTE — Brief Op Note (Signed)
12/10/2018  10:19 AM  PATIENT:  Brittany Nielsen  68 y.o. female  PRE-OPERATIVE DIAGNOSIS:  osteoarthritis left hip  POST-OPERATIVE DIAGNOSIS:  osteoarthritis left hip  PROCEDURE:  Procedure(s): LEFT TOTAL HIP ARTHROPLASTY ANTERIOR APPROACH (Left)  SURGEON:  Surgeon(s) and Role:    Mcarthur Rossetti, MD - Primary  PHYSICIAN ASSISTANT: Benita Stabile, PA-C  ANESTHESIA:   spinal  EBL:  300 mL   COUNTS:  YES  PLAN OF CARE: Admit to inpatient   PATIENT DISPOSITION:  PACU - hemodynamically stable.   Delay start of Pharmacological VTE agent (>24hrs) due to surgical blood loss or risk of bleeding: no

## 2018-12-10 NOTE — Anesthesia Procedure Notes (Signed)
Date/Time: 12/10/2018 8:38 AM Performed by: Sharlette Dense, CRNA Oxygen Delivery Method: Simple face mask

## 2018-12-10 NOTE — H&P (Signed)
TOTAL HIP ADMISSION H&P  Patient is admitted for left total hip arthroplasty.  Subjective:  Chief Complaint: left hip pain  HPI: Brittany Nielsen, 68 y.o. female, has a history of pain and functional disability in the left hip(s) due to arthritis and patient has failed non-surgical conservative treatments for greater than 12 weeks to include NSAID's and/or analgesics, corticosteriod injections, flexibility and strengthening excercises, supervised PT with diminished ADL's post treatment, use of assistive devices, weight reduction as appropriate and activity modification.  Onset of symptoms was gradual starting 3 years ago with gradually worsening course since that time.The patient noted no past surgery on the left hip(s).  Patient currently rates pain in the left hip at 10 out of 10 with activity. Patient has night pain, worsening of pain with activity and weight bearing, trendelenberg gait, pain that interfers with activities of daily living and pain with passive range of motion. Patient has evidence of subchondral cysts, subchondral sclerosis, periarticular osteophytes and joint space narrowing by imaging studies. This condition presents safety issues increasing the risk of falls.  There is no current active infection.  Patient Active Problem List   Diagnosis Date Noted  . Status post total replacement of right hip 02/26/2018  . Unilateral primary osteoarthritis, left hip 01/25/2018  . Unilateral primary osteoarthritis, right hip 01/25/2018   Past Medical History:  Diagnosis Date  . Arthritis   . Diabetes mellitus without complication ([place])   . Elevated cholesterol   . Family history of adverse reaction to anesthesia    MOTHER NAUSEA   . Hx of bronchitis    I HAD IT BACK IN SEPTEMBER 2019 AND I USUALLY HAVE ONCE A YEAR   . Hx of hypokalemia   . Hypertension   . Panic attacks   . Sleep apnea    CPAP     Past Surgical History:  Procedure Laterality Date  . CHOLECYSTECTOMY     . EYE SURGERY Bilateral 10/13/18, 10/26/18  . JOINT REPLACEMENT Right 02/26/2018  . TOTAL HIP ARTHROPLASTY Right 02/26/2018   Procedure: RIGHT TOTAL HIP ARTHROPLASTY ANTERIOR APPROACH;  Surgeon: Mcarthur Rossetti, MD;  Location: [facility];  Service: Orthopedics;  Laterality: Right;  . TUBAL LIGATION      Current Facility-Administered Medications  Medication Dose Route Frequency Provider Last Rate Last Dose  . ceFAZolin (ANCEF) 3 g in dextrose 5 % 50 mL IVPB  3 g Intravenous On Call to OR Mcarthur Rossetti, MD       No Known Allergies  Social History   Tobacco Use  . Smoking status: Never Smoker  . Smokeless tobacco: Never Used  Substance Use Topics  . Alcohol use: Never    Frequency: Never    No family history on file.   Review of Systems  Musculoskeletal: Positive for joint pain.  All other systems reviewed and are negative.   Objective:  Physical Exam  Constitutional: She is oriented to person, place, and time. She appears well-developed and well-nourished.  HENT:  Head: Normocephalic and atraumatic.  Eyes: Pupils are equal, round, and reactive to light. EOM are normal.  Neck: Normal range of motion. Neck supple.  Cardiovascular: Normal rate.  Respiratory: Effort normal.  GI: Soft.  Musculoskeletal:     Left hip: She exhibits decreased range of motion, decreased strength, tenderness and bony tenderness.  Neurological: She is alert and oriented to person, place, and time.  Skin: Skin is warm and dry.  Psychiatric: She has a normal mood and affect.  Vital signs in last 24 hours:    Labs:   Estimated body mass index is 49.11 kg/m as calculated from the following:   Height as of 12/06/18: 5\' 2"  (1.575 m).   Weight as of 12/06/18: 121.8 kg.   Imaging Review Plain radiographs demonstrate severe degenerative joint disease of the left hip(s). The bone quality appears to be good for age and reported activity level.      Assessment/Plan:  End stage  arthritis, left hip(s)  The patient history, physical examination, clinical judgement of the provider and imaging studies are consistent with end stage degenerative joint disease of the left hip(s) and total hip arthroplasty is deemed medically necessary. The treatment options including medical management, injection therapy, arthroscopy and arthroplasty were discussed at length. The risks and benefits of total hip arthroplasty were presented and reviewed. The risks due to aseptic loosening, infection, stiffness, dislocation/subluxation,  thromboembolic complications and other imponderables were discussed.  The patient acknowledged the explanation, agreed to proceed with the plan and consent was signed. Patient is being admitted for inpatient treatment for surgery, pain control, PT, OT, prophylactic antibiotics, VTE prophylaxis, progressive ambulation and ADL's and discharge planning.The patient is planning to be discharged home with home health services

## 2018-12-10 NOTE — Evaluation (Signed)
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  Pt should progress to dc home with family assist.    Follow Up Recommendations Follow surgeon's recommendation for DC plan and follow-up therapies    Equipment Recommendations  None recommended by PT    Recommendations for Other Services       Precautions / Restrictions Precautions Precautions: Fall Restrictions Weight Bearing Restrictions: No Other Position/Activity Restrictions: WBAT      Mobility  Bed Mobility Overal bed mobility: Needs Assistance Bed Mobility: Supine to Sit     Supine to sit: Min assist;HOB elevated     General bed mobility comments: cues for sequence, use of bedrails  Transfers Overall transfer level: Needs assistance Equipment used: Rolling walker (2 wheeled) Transfers: Sit to/from Stand Sit to Stand: Min assist         General transfer comment: cues for LE management and use of UEs to self assist  Ambulation/Gait Ambulation/Gait assistance: Min assist Gait Distance (Feet): 16 Feet Assistive device: Rolling walker (2 wheeled) Gait Pattern/deviations: Step-to pattern;Decreased step length - right;Decreased step length - left;Shuffle;Trunk flexed Gait velocity: decr   General Gait Details: cues for sequence, posture and position from RW; distance ltd by fatigue  Stairs            Wheelchair Mobility    Modified Rankin (Stroke Patients Only)       Balance Overall balance assessment: Mild deficits observed, not formally tested                                           Pertinent Vitals/Pain Pain Assessment: 0-10 Pain Score: 5  Pain Location: L hip Pain  Descriptors / Indicators: Aching;Sore Pain Intervention(s): Limited activity within patient's tolerance;Monitored during session;Premedicated before session;Ice applied    Home Living Family/patient expects to be discharged to:: Private residence Living Arrangements: Spouse/significant other;Children Available Help at Discharge: Family Type of Home: House Home Access: Stairs to enter Entrance Stairs-Rails: None Entrance Stairs-Number of Steps: 2 Home Layout: One level Home Equipment: Environmental consultant - 2 wheels;Cane - single point      Prior Function Level of Independence: Needs assistance   Gait / Transfers Assistance Needed: use of cane vs RW to assist  ADL's / Homemaking Assistance Needed: assist for socks        Hand Dominance   Dominant Hand: Right    Extremity/Trunk Assessment   Upper Extremity Assessment Upper Extremity Assessment: Overall WFL for tasks assessed    Lower Extremity Assessment Lower Extremity Assessment: LLE deficits/detail    Cervical / Trunk Assessment Cervical / Trunk Assessment: Normal  Communication   Communication: No difficulties  Cognition Arousal/Alertness: Awake/alert Behavior During Therapy: WFL for tasks assessed/performed Overall Cognitive Status: Within Functional Limits for tasks assessed                                        General Comments      Exercises Total Joint Exercises Ankle Circles/Pumps: AROM;Both;Supine;15 reps   Assessment/Plan  PT Assessment Patient needs continued PT services  PT Problem List Decreased strength;Decreased activity tolerance;Decreased balance;Decreased range of motion;Decreased mobility;Decreased knowledge of use of DME;Obesity;Pain       PT Treatment Interventions DME instruction;Gait training;Stair training;Functional mobility training;Therapeutic activities;Therapeutic exercise;Patient/family education    PT Goals (Current goals can be found in the Care Plan section)  Acute  Rehab PT Goals Patient Stated Goal: regain IND PT Goal Formulation: With patient Time For Goal Achievement: 12/17/18 Potential to Achieve Goals: Good    Frequency 7X/week   Barriers to discharge        Co-evaluation               AM-PAC PT "6 Clicks" Mobility  Outcome Measure Help needed turning from your back to your side while in a flat bed without using bedrails?: A Lot Help needed moving from lying on your back to sitting on the side of a flat bed without using bedrails?: A Lot Help needed moving to and from a bed to a chair (including a wheelchair)?: A Little Help needed standing up from a chair using your arms (e.g., wheelchair or bedside chair)?: A Little Help needed to walk in hospital room?: A Little Help needed climbing 3-5 steps with a railing? : A Lot 6 Click Score: 15    End of Session Equipment Utilized During Treatment: Gait belt Activity Tolerance: Patient tolerated treatment well;Patient limited by fatigue Patient left: in chair;with call bell/phone within reach;with chair alarm set Nurse Communication: Mobility status PT Visit Diagnosis: Difficulty in walking, not elsewhere classified (R26.2)    Time: 6578-4696 PT Time Calculation (min) (ACUTE ONLY): 18 min   Charges:   PT Evaluation $PT Eval Low Complexity: 1 Low          Mauro Kaufmann PT Acute Rehabilitation Services Pager [phone] Office [phone]   Brittany Nielsen 12/10/2018, 5:10 PM

## 2018-12-10 NOTE — Op Note (Signed)
Brittany Nielsen, PAONE MEDICAL RECORD QA:[id number] ACCOUNT [id number] DATE OF BIRTH:1950-12-25 FACILITY: [facility] LOCATION: [facility] PHYSICIAN:Kaiyden Simkin [place], MD  OPERATIVE REPORT  DATE OF PROCEDURE:  12/10/2018  PREOPERATIVE DIAGNOSES:   1.  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 2.  Morbid obesity with a BMI of over 40.  POSTOPERATIVE DIAGNOSES:   1.  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 2.  Morbid obesity with a BMI of over 40.  PROCEDURE:  Left total hip arthroplasty through direct anterior approach.  IMPLANTS:  DePuy Sector Gription acetabular component size 52, size 36+0 neutral polyethylene liner, size 10 Corail femoral component with standard offset, size 36-2 metal-on-metal hip ball.  SURGEON:  Lind Guest. Ninfa Linden, MD  ASSISTANT:  Erskine Emery, PA-C  ANESTHESIA:  Spinal.  ANTIBIOTICS:  3 g IV Ancef.  ESTIMATED BLOOD LOSS:  300 mL.  COMPLICATIONS:  None.  INDICATIONS:  The patient is a 68 year old female well known to me.  She has debilitating arthritis involving both her hips.  In February of this year, she underwent a successful right total hip arthroplasty.  She now presents for left total hip  arthroplasty given the severity of arthritis in the left side.  At this point, the arthritis on the left side is detrimentally affecting her activities of daily living, quality of life and her mobility.  She had success on the right hip so she wished to  proceed with surgery on the left hip before.  She understands this is quite difficult given her morbid obesity, and she has a heightened risk of acute blood loss anemia, nerve and vessel injury, fracture, infection, dislocation, DVT and implant failure.   She understands our goals are for decreased pain, improve mobility and overall improve quality of life.  DESCRIPTION OF PROCEDURE:  After informed consent was obtained and  appropriate left hip was marked she was brought to the operating room and sat up on a stretcher where spinal anesthesia was then obtained.  She was then laid in supine position on a  stretcher.  Traction boots were placed on both her feet.  Next, she was placed supine on the Hana fracture table, the perineal post in place and both legs in line skeletal traction device and no traction applied.  Her left operative hip was prepped and  draped with DuraPrep and sterile drapes.  A timeout was called to she was identified as correct patient and correct left hip.  I then made an incision just inferior and posterior to the anterior superior iliac spine and carried this obliquely down the  leg.  We dissected down tensor fascia lata muscle.  Tensor fascia was then divided longitudinally to proceed with direct anterior approach to the hip.  We identified and cauterized circumflex vessels and identified the hip capsule, opened the hip capsule  in an L-type format finding a moderate joint effusion and significant periarticular osteophytes around the femoral head and neck.  We then made our femoral neck cut with an oscillating saw just proximal to the lesser trochanter and completed this with  an osteotome.  We placed a corkscrew guide in the femoral head and removed the femoral heads entirety to find a wide area devoid of cartilage.  We then placed a bent Hohmann over the medial acetabular rim and removed remnants of the acetabular labrum and  other debris.  We then began reaming from a size 44 reamer in stepwise increments up to a size 51 with all reamers under  direct visualization, the last reamer under direct fluoroscopy, so we could obtain our depth of reaming, our inclination and  anteversion.  I then placed the real DePuy Sector Gription acetabular component size 52 and a 36+0 neutral polyethylene liner for that size acetabular component.  Attention was then turned to the femur.  With the leg externally rotated to  120 degrees,  extended and adducted we placed the Mueller retractor medially and Hohman retractor behind the greater trochanter, released lateral joint capsule and used a box-cutting osteotome to enter the femoral canal and a rongeur to lateralize.  We then began  broaching using the Corail broaching system from a size 8 going up to just a size 10.  With a size 10 in place, we trialed a standard offset femoral neck and a 36-2 hip ball, reduced this in the acetabulum.  We were pleased with the leg length, offset,  range of motion and stability.  This was assessed mechanically and radiographically.  We then dislocated the hip and removed the trial components.  We placed the real Corail femoral component size 10 with standard offset and the real 36-2 metal-on-metal  hip ball and again reduced this in the acetabulum and it was stable.  We then irrigated the soft tissue with normal saline solution using pulsatile lavage.  We were able to close the joint capsule with interrupted #1 Ethibond suture.  We closed deep  tissue with 0 Vicryl followed by 2-0 Vicryl in subcutaneous tissue and interrupted staples on the skin.  Xeroform and Aquacel dressing was applied.  She was taken off the Hana table and taken to recovery room in stable condition.  All final counts were  correct.  There were no complications noted.  Of note, Rexene Edison, PA-C, assisted in the entire case.  His assistance was crucial for facilitating all aspects of this case.  CN/NUANCE  D:12/10/2018 T:12/10/2018 JOB:[id number]

## 2018-12-10 NOTE — Transfer of Care (Signed)
Immediate Anesthesia Transfer of Care Note  Patient: Brittany Nielsen  Procedure(s) Performed: LEFT TOTAL HIP ARTHROPLASTY ANTERIOR APPROACH (Left Hip)  Patient Location: PACU  Anesthesia Type:Spinal  Level of Consciousness: awake, alert  and oriented  Airway & Oxygen Therapy: Patient Spontanous Breathing and Patient connected to face mask oxygen  Post-op Assessment: Report given to RN and Post -op Vital signs reviewed and stable  Post vital signs: Reviewed and stable  Last Vitals:  Vitals Value Taken Time  BP 114/71 12/10/18 1040  Temp    Pulse 90 12/10/18 1041  Resp 17 12/10/18 1041  SpO2 97 % 12/10/18 1041  Vitals shown include unvalidated device data.  Last Pain:  Vitals:   12/10/18 0733  TempSrc:   PainSc: 0-No pain         Complications: No apparent anesthesia complications

## 2018-12-10 NOTE — Anesthesia Preprocedure Evaluation (Signed)
Anesthesia Evaluation  Patient identified by MRN, date of birth, ID band Patient awake    Reviewed: Allergy & Precautions, NPO status , Patient's Chart, lab work & pertinent test results  Airway Mallampati: III  TM Distance: >3 FB Neck ROM: Full    Dental  (+) Teeth Intact, Dental Advisory Given   Pulmonary sleep apnea and Continuous Positive Airway Pressure Ventilation ,    Pulmonary exam normal breath sounds clear to auscultation       Cardiovascular hypertension, Pt. on medications Normal cardiovascular exam Rhythm:Regular Rate:Normal     Neuro/Psych PSYCHIATRIC DISORDERS Anxiety negative neurological ROS     GI/Hepatic negative GI ROS, Neg liver ROS,   Endo/Other  diabetes, Type 2, Oral Hypoglycemic AgentsMorbid obesity  Renal/GU negative Renal ROS     Musculoskeletal  (+) Arthritis , Osteoarthritis,    Abdominal   Peds  Hematology negative hematology ROS (+) Plt 280k   Anesthesia Other Findings Day of surgery medications reviewed with the patient.  Reproductive/Obstetrics                             Anesthesia Physical Anesthesia Plan  ASA: III  Anesthesia Plan: Spinal   Post-op Pain Management:    Induction:   PONV Risk Score and Plan: 2 and Propofol infusion and Treatment may vary due to age or medical condition  Airway Management Planned: Nasal Cannula and Natural Airway  Additional Equipment:   Intra-op Plan:   Post-operative Plan:   Informed Consent: I have reviewed the patients History and Physical, chart, labs and discussed the procedure including the risks, benefits and alternatives for the proposed anesthesia with the patient or authorized representative who has indicated his/her understanding and acceptance.     Dental advisory given  Plan Discussed with: CRNA, Anesthesiologist and Surgeon  Anesthesia Plan Comments:         Anesthesia Quick  Evaluation

## 2018-12-11 DIAGNOSIS — M1612 Unilateral primary osteoarthritis, left hip: Secondary | ICD-10-CM | POA: Diagnosis not present

## 2018-12-11 LAB — CBC
HCT: 38.3 % (ref 36.0–46.0)
Hemoglobin: 11.8 g/dL — ABNORMAL LOW (ref 12.0–15.0)
MCH: 27.4 pg (ref 26.0–34.0)
MCHC: 30.8 g/dL (ref 30.0–36.0)
MCV: 89.1 fL (ref 80.0–100.0)
Platelets: 316 10*3/uL (ref 150–400)
RBC: 4.3 MIL/uL (ref 3.87–5.11)
RDW: 13.9 % (ref 11.5–15.5)
WBC: 16.3 10*3/uL — ABNORMAL HIGH (ref 4.0–10.5)
nRBC: 0 % (ref 0.0–0.2)

## 2018-12-11 LAB — BASIC METABOLIC PANEL
Anion gap: 12 (ref 5–15)
BUN: 22 mg/dL (ref 8–23)
CO2: 23 mmol/L (ref 22–32)
Calcium: 8.7 mg/dL — ABNORMAL LOW (ref 8.9–10.3)
Chloride: 97 mmol/L — ABNORMAL LOW (ref 98–111)
Creatinine, Ser: 1.08 mg/dL — ABNORMAL HIGH (ref 0.44–1.00)
GFR calc Af Amer: >60 mL/min (ref >60)
GFR calc non Af Amer: 53 mL/min — ABNORMAL LOW (ref >60)
Glucose, Bld: 175 mg/dL — ABNORMAL HIGH (ref 70–99)
Potassium: 4.7 mmol/L (ref 3.5–5.1)
Sodium: 132 mmol/L — ABNORMAL LOW (ref 135–145)

## 2018-12-11 LAB — GLUCOSE, CAPILLARY
Glucose-Capillary: 160 mg/dL — ABNORMAL HIGH (ref 70–99)
Glucose-Capillary: 145 mg/dL — ABNORMAL HIGH (ref 70–99)

## 2018-12-11 MED ORDER — METHOCARBAMOL 500 MG PO TABS: 500.0000 mg | ORAL_TABLET | Freq: Four times a day (QID) | ORAL | 1 refills | Status: AC | PRN

## 2018-12-11 MED ORDER — OXYCODONE HCL 5 MG PO TABS: 5.0000 mg | ORAL_TABLET | ORAL | 0 refills | Status: AC | PRN

## 2018-12-11 MED ORDER — ASPIRIN 81 MG PO CHEW: 81.0000 mg | CHEWABLE_TABLET | Freq: Two times a day (BID) | ORAL | 0 refills | Status: AC

## 2018-12-11 MED ORDER — LISINOPRIL 20 MG PO TABS
20.0000 mg | ORAL_TABLET | Freq: Every day | ORAL | Status: DC
  Administered 2018-12-11: 20 mg via ORAL
  Filled 2018-12-11: qty 1

## 2018-12-11 NOTE — Progress Notes (Signed)
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  Pt OOB to ambulate short distance in hall and then up to chair.   Follow Up Recommendations  Follow surgeon's recommendation for DC plan and follow-up therapies     Equipment Recommendations  None recommended by PT    Recommendations for Other Services       Precautions / Restrictions Precautions Precautions: Fall Restrictions Weight Bearing Restrictions: No Other Position/Activity Restrictions: WBAT    Mobility  Bed Mobility               General bed mobility comments: Pt sitting EOB on arrival to room  Transfers Overall transfer level: Needs assistance Equipment used: Rolling walker (2 wheeled) Transfers: Sit to/from Stand Sit to Stand: Min guard;From elevated surface         General transfer comment: cues for LE management and use of UEs to self assist  Ambulation/Gait Ambulation/Gait assistance: Min guard Gait Distance (Feet): 28 Feet Assistive device: Rolling walker (2 wheeled) Gait Pattern/deviations: Step-to pattern;Decreased step length - right;Decreased step length - left;Shuffle;Trunk flexed Gait velocity: decr   General Gait Details: cues for sequence, posture and position from RW; distance ltd by fatigue   Stairs             Wheelchair Mobility    Modified Rankin (Stroke Patients Only)       Balance Overall balance assessment: Mild deficits observed, not formally tested                                          Cognition Arousal/Alertness: Awake/alert Behavior During Therapy: WFL for tasks assessed/performed Overall Cognitive Status: Within Functional Limits for tasks  assessed                                        Exercises Total Joint Exercises Ankle Circles/Pumps: AROM;Both;Supine;15 reps    General Comments        Pertinent Vitals/Pain Pain Assessment: 0-10 Pain Score: 4  Pain Location: L hip Pain Descriptors / Indicators: Aching;Sore Pain Intervention(s): Limited activity within patient's tolerance;Monitored during session;Premedicated before session;Ice applied    Home Living                      Prior Function            PT Goals (current goals can now be found in the care plan section) Acute Rehab PT Goals Patient Stated Goal: regain IND PT Goal Formulation: With patient Time For Goal Achievement: 12/17/18 Potential to Achieve Goals: Good Progress towards PT goals: Progressing toward goals    Frequency    7X/week      PT Plan Current plan remains appropriate    Co-evaluation              AM-PAC PT "6 Clicks" Mobility   Outcome Measure  Help needed turning from your back to your side while in a flat bed without using bedrails?: A Lot Help needed moving from  lying on your back to sitting on the side of a flat bed without using bedrails?: A Lot Help needed moving to and from a bed to a chair (including a wheelchair)?: A Little Help needed standing up from a chair using your arms (e.g., wheelchair or bedside chair)?: A Little Help needed to walk in hospital room?: A Little Help needed climbing 3-5 steps with a railing? : A Lot 6 Click Score: 15    End of Session Equipment Utilized During Treatment: Gait belt Activity Tolerance: Patient tolerated treatment well;Patient limited by fatigue Patient left: in chair;with call bell/phone within reach;with chair alarm set Nurse Communication: Mobility status PT Visit Diagnosis: Difficulty in walking, not elsewhere classified (R26.2)     Time: 2993-7169 PT Time Calculation (min) (ACUTE ONLY): 20 min  Charges:  $Gait Training: 8-22  mins                     Mauro Kaufmann PT Acute Rehabilitation Services Pager [phone] Office [phone]    Chennel Olivos 12/11/2018, 11:49 AM

## 2018-12-11 NOTE — Care Management CC44 (Signed)
Condition Code 44 Documentation Completed  Patient Details  Name: Brittany Nielsen MRN: [id number] Date of Birth: Apr 29, 1950   Condition Code 44 given:  Yes Patient signature on Condition Code 44 notice:  Yes Documentation of 2 MD's agreement:  Yes Code 44 added to claim:  Yes    Joaquin Courts, RN 12/11/2018, 10:45 AM

## 2018-12-11 NOTE — Progress Notes (Signed)
Discharge paperwork discussed with pt at the bedside. She demonstrated understanding.  Pt was escorted by wheelchair to main lobby in stable condition.

## 2018-12-11 NOTE — TOC Progression Note (Signed)
Transition of Care [facility]) - Progression Note    Patient Details  Name: Narissa Caci Orren MRN: [id number] Date of Birth: 1950-10-26  [facility] CM/SW Contact  Joaquin Courts, RN Phone Number: 12/11/2018, 10:27 AM  Clinical Narrative:    CM spoke with patient, patient set up for HHPT with [facility]. Reports has rolling walker and 3-in-1 at home.   Expected Discharge Plan: [place] Barriers to Discharge: No Barriers Identified  Expected Discharge Plan and Services Expected Discharge Plan: [place]   Discharge Planning Services: CM Consult Post Acute Care Choice: [place] arrangements for the past 2 months: Single Family Home Expected Discharge Date: 12/11/18               DME Arranged: N/A DME Agency: NA       HH Arranged: PT HH Agency: [facility] (formerly [employer])     Representative spoke with at [place]: pre arranged in MD office   Social Determinants of Health ([place]) Interventions    Readmission Risk Interventions No flowsheet data found.

## 2018-12-11 NOTE — Progress Notes (Signed)
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  Pt is currently mobilizing at sup/min guard assist but continues to fatigue easily.  Pt wishes to dc home this date stating she had opposite hip replaced earlier this year, her spouse is a very capable care-giver and Dr Magnus Ivan has HHPT coming to the house.  Follow Up Recommendations  Follow surgeon's recommendation for DC plan and follow-up therapies     Equipment Recommendations  None recommended by PT    Recommendations for Other Services       Precautions / Restrictions Precautions Precautions: Fall Restrictions Weight Bearing Restrictions: No Other Position/Activity Restrictions: WBAT    Mobility  Bed Mobility               General bed mobility comments: Pt sitting EOB on arrival to room; Pt states she moved supine to sit on own  Transfers Overall transfer level: Needs assistance Equipment used: Rolling walker (2 wheeled) Transfers: Sit to/from Stand Sit to Stand: Supervision         General transfer comment: cues for LE management and use of UEs to self assist  Ambulation/Gait Ambulation/Gait assistance: Min guard Gait Distance (Feet): 65 Feet Assistive device: Rolling walker (2 wheeled) Gait Pattern/deviations: Step-to pattern;Decreased step length - right;Decreased step length - left;Shuffle;Trunk flexed Gait velocity: decr   General Gait Details: min cues for sequence, posture and position from RW; distance ltd by fatigue   Stairs Stairs: Yes Stairs assistance: Min assist Stair Management: Two rails;Step to pattern;Forwards Number of Stairs: 3 General stair comments: min cues for sequence   Wheelchair Mobility    Modified Rankin  (Stroke Patients Only)       Balance Overall balance assessment: Mild deficits observed, not formally tested                                          Cognition Arousal/Alertness: Awake/alert Behavior During Therapy: WFL for tasks assessed/performed Overall Cognitive Status: Within Functional Limits for tasks assessed                                        Exercises Total Joint Exercises Ankle Circles/Pumps: AROM;Both;Supine;15 reps    General Comments        Pertinent Vitals/Pain Pain Assessment: 0-10 Pain Score: 4  Pain Location: L hip Pain Descriptors / Indicators: Aching;Sore Pain Intervention(s): Limited activity within patient's tolerance;Monitored during session;Premedicated before session    Home Living                      Prior Function            PT Goals (current goals can now be found in the care plan section) Acute Rehab PT Goals Patient Stated Goal: regain IND PT Goal Formulation: With patient Time For Goal Achievement: 12/17/18 Potential to Achieve Goals: Good Progress towards PT goals: Progressing toward goals    Frequency    7X/week      PT Plan Current plan remains appropriate  Co-evaluation              AM-PAC PT "6 Clicks" Mobility   Outcome Measure  Help needed turning from your back to your side while in a flat bed without using bedrails?: A Little Help needed moving from lying on your back to sitting on the side of a flat bed without using bedrails?: A Little Help needed moving to and from a bed to a chair (including a wheelchair)?: A Little Help needed standing up from a chair using your arms (e.g., wheelchair or bedside chair)?: A Little Help needed to walk in hospital room?: A Little Help needed climbing 3-5 steps with a railing? : A Little 6 Click Score: 18    End of Session Equipment Utilized During Treatment: Gait belt Activity Tolerance: Patient tolerated treatment  well;Patient limited by fatigue Patient left: in chair;with call bell/phone within reach;with chair alarm set Nurse Communication: Mobility status PT Visit Diagnosis: Difficulty in walking, not elsewhere classified (R26.2)     Time: 5329-9242 PT Time Calculation (min) (ACUTE ONLY): 21 min  Charges:  $Gait Training: 8-22 mins                     [place] Pager [phone] Office [phone]    Jordan Caraveo 12/11/2018, 3:04 PM

## 2018-12-11 NOTE — Discharge Instructions (Signed)

## 2018-12-11 NOTE — Progress Notes (Signed)
Subjective: 1 Day Post-Op Procedure(s) (LRB): LEFT TOTAL HIP ARTHROPLASTY ANTERIOR APPROACH (Left) Patient reports pain as moderate.  Did not sleep well last night due to her C-PAP machine.  She felt like it was up too high.  Also, complaint about being on insulin sliding scale and a night-time dose since she is not on insulin at home.  Objective: Vital signs in last 24 hours: Temp:  [97.7 F (36.5 C)-98.5 F (36.9 C)] 98.5 F (36.9 C) (11/28 0504) Pulse Rate:  [77-104] 95 (11/28 0504) Resp:  [15-20] 16 (11/28 0504) BP: (118-155)/(57-87) 144/70 (11/28 0504) SpO2:  [92 %-100 %] 96 % (11/28 0504) Weight:  [121.8 kg] 121.8 kg (11/27 1142)  Intake/Output from previous day: 11/27 0701 - 11/28 0700 In: 3720 [P.O.:170; I.V.:3300; IV Piggyback:250] Out: 1000 [Urine:700; Blood:300] Intake/Output this shift: No intake/output data recorded.  Recent Labs    12/11/18 0315  HGB 11.8*   Recent Labs    12/11/18 0315  WBC 16.3*  RBC 4.30  HCT 38.3  PLT 316   Recent Labs    12/11/18 0315  NA 132*  K 4.7  CL 97*  CO2 23  BUN 22  CREATININE 1.08*  GLUCOSE 175*  CALCIUM 8.7*   No results for input(s): LABPT, INR in the last 72 hours.  Sensation intact distally Intact pulses distally Dorsiflexion/Plantar flexion intact Incision: scant drainage   Assessment/Plan: 1 Day Post-Op Procedure(s) (LRB): LEFT TOTAL HIP ARTHROPLASTY ANTERIOR APPROACH (Left) Up with therapy Discharge home with home health possible later this afternoon if feeling well. Will D/C insulin      Mcarthur Rossetti 12/11/2018, 8:52 AM

## 2018-12-11 NOTE — Discharge Summary (Signed)
Patient ID: Collier Monica MRN: [id number] DOB/AGE: February 22, 1950 68 y.o.  Admit date: 12/10/2018 Discharge date: 12/11/2018  Admission Diagnoses:  Principal Problem:   Unilateral primary osteoarthritis, left hip Active Problems:   Status post total replacement of left hip   Status post total hip replacement, left   Discharge Diagnoses:  Same  Past Medical History:  Diagnosis Date  . Arthritis   . Diabetes mellitus without complication ([place])   . Elevated cholesterol   . Family history of adverse reaction to anesthesia    MOTHER NAUSEA   . Hx of bronchitis    I HAD IT BACK IN SEPTEMBER 2019 AND I USUALLY HAVE ONCE A YEAR   . Hx of hypokalemia   . Hypertension   . Panic attacks   . Sleep apnea    CPAP     Surgeries: Procedure(s): LEFT TOTAL HIP ARTHROPLASTY ANTERIOR APPROACH on 12/10/2018   Consultants:   Discharged Condition: Improved  Hospital Course: Lenay Rio Taber is an 68 y.o. female who was admitted 12/10/2018 for operative treatment ofUnilateral primary osteoarthritis, left hip. Patient has severe unremitting pain that affects sleep, daily activities, and work/hobbies. After pre-op clearance the patient was taken to the operating room on 12/10/2018 and underwent  Procedure(s): LEFT TOTAL HIP ARTHROPLASTY ANTERIOR APPROACH.    Patient was given perioperative antibiotics:  Anti-infectives (From admission, onward)   Start     Dose/Rate Route Frequency Ordered Stop   12/10/18 1500  ceFAZolin (ANCEF) IVPB 2g/100 mL premix     2 g 200 mL/hr over 30 Minutes Intravenous Every 6 hours 12/10/18 1144 12/10/18 2216   12/10/18 0600  ceFAZolin (ANCEF) 3 g in dextrose 5 % 50 mL IVPB     3 g 100 mL/hr over 30 Minutes Intravenous On call to O.R. 12/09/18 1355 12/10/18 0851       Patient was given sequential compression devices, early ambulation, and chemoprophylaxis to prevent DVT.  Patient benefited maximally from hospital stay and there were no  complications.    Recent vital signs:  Patient Vitals for the past 24 hrs:  BP Temp Temp src Pulse Resp SpO2  12/11/18 1003 134/64 98.3 F (36.8 C) Oral 94 16 97 %  12/11/18 0504 (!) 144/70 98.5 F (36.9 C) Oral 95 16 96 %  12/11/18 0101 (!) 147/78 98 F (36.7 C) Oral (!) 104 18 95 %  12/10/18 2211 (!) 155/87 98.1 F (36.7 C) Oral (!) 103 16 96 %     Recent laboratory studies:  Recent Labs    12/11/18 0315  WBC 16.3*  HGB 11.8*  HCT 38.3  PLT 316  NA 132*  K 4.7  CL 97*  CO2 23  BUN 22  CREATININE 1.08*  GLUCOSE 175*  CALCIUM 8.7*     Discharge Medications:   Allergies as of 12/11/2018   No Known Allergies     Medication List    TAKE these medications   amLODipine 10 MG tablet Commonly known as: NORVASC Take 10 mg by mouth at bedtime.   aspirin 81 MG chewable tablet Chew 1 tablet (81 mg total) by mouth 2 (two) times daily. What changed: when to take this   CLARINEX-D 24 HOUR PO Take 10 mg by mouth daily.   diclofenac sodium 1 % Gel Commonly known as: Voltaren Apply 2 g topically 4 (four) times daily. What changed:   when to take this  reasons to take this   glipiZIDE 5 MG tablet Commonly known as: GLUCOTROL Take  5 mg by mouth daily.   lisinopril-hydrochlorothiazide 20-12.5 MG tablet Commonly known as: ZESTORETIC Take 2 tablets by mouth at bedtime.   methocarbamol 500 MG tablet Commonly known as: ROBAXIN Take 1 tablet (500 mg total) by mouth every 6 (six) hours as needed for muscle spasms.   oxyCODONE 5 MG immediate release tablet Commonly known as: Oxy IR/ROXICODONE Take 1-2 tablets (5-10 mg total) by mouth every 4 (four) hours as needed for moderate pain (pain score 4-6).   PARoxetine 40 MG tablet Commonly known as: PAXIL Take 40 mg by mouth at bedtime.   potassium chloride 10 MEQ tablet Commonly known as: KLOR-CON Take 20 mEq by mouth 2 (two) times daily.   rosuvastatin 10 MG tablet Commonly known as: CRESTOR Take 10 mg by  mouth at bedtime.            Durable Medical Equipment  (From admission, onward)         Start     Ordered   12/10/18 1145  DME Walker rolling  Once    Question:  Patient needs a walker to treat with the following condition  Answer:  Status post total replacement of left hip   12/10/18 1144   12/10/18 1145  DME 3 n 1  Once     12/10/18 1144          Diagnostic Studies: Dg Pelvis Portable  Result Date: 12/10/2018 CLINICAL DATA:  Left total hip arthroplasty EXAM: PORTABLE PELVIS 1-2 VIEWS COMPARISON:  02/26/2018 FINDINGS: Interval postsurgical changes from left total hip arthroplasty. Arthroplasty components appear in their expected alignment without evidence of immediate postoperative complication. Expected postoperative changes within the overlying soft tissues. Right hip arthroplasty hardware appears intact. IMPRESSION: Expected postoperative appearance following left total hip arthroplasty. No evidence of immediate postoperative complication. Electronically Signed   By: Duanne GuessNicholas  Plundo M.D.   On: 12/10/2018 11:22   Dg C-arm 1-60 Min-no Report  Result Date: 12/10/2018 Fluoroscopy was utilized by the requesting physician.  No radiographic interpretation.   Dg Hip Operative Unilat W Or W/o Pelvis Left  Result Date: 12/10/2018 CLINICAL DATA:  Left total hip arthroplasty EXAM: OPERATIVE LEFT HIP (WITH PELVIS IF PERFORMED) AP VIEWS TECHNIQUE: Fluoroscopic spot image(s) were submitted for interpretation post-operatively. COMPARISON:  02/26/2010 FINDINGS: 5 C-arm fluoroscopic images were obtained intraoperatively and submitted for post operative interpretation. Sequential images demonstrate interval placement of left total hip arthroplasty hardware. Components appear in their expected alignment. No obvious intraoperative complication. Expected postoperative changes within the soft tissues. 23 seconds of fluoroscopy time was utilized. Please see the performing provider's procedural  report for further detail. IMPRESSION: As above. Electronically Signed   By: Duanne GuessNicholas  Plundo M.D.   On: 12/10/2018 11:28   Xr Hips Bilat W Or W/o Pelvis 3-4 Views  Result Date: 11/15/2018 A low AP pelvis and lateral of both hips shows a well-seated right total hip arthroplasty with no complicating features.  The left hip has significant joint space narrowing as well as sclerotic changes in the femoral head and neck.  The osteoarthritis is severe with the left hip.  There are small periarticular osteophytes.   Disposition: Discharge disposition: 01-Home or Self Care         Follow-up Information    Kathryne HitchBlackman, Kylyn Sookram Y, MD Follow up in 2 week(s).   Specialty: Orthopedic Surgery Contact information: [address] [place] [address] [phone]        [facility] Follow up.   Specialty: Home Health Services Why:  agency will provide home health physical therapy. agency will call you to schedule first visit. Contact information: [address] [place] [address] [phone]            Signed: Kathryne Hitch 12/11/2018, 5:28 PM

## 2018-12-11 NOTE — Progress Notes (Signed)
    Home health agencies that serve [phone].        [place] Quality of Patient Care Rating Patient Survey Summary Rating  [facility] [phone] 4 out of 5 stars 5 out of [place] [phone] 4  out of 5 stars 4 out of [place] [phone] 4  out of 5 stars 4 out of [place] [phone] 4 out of 5 stars 4 out of [place] [phone] 3  out of 5 stars 4 out of [place] [phone] 4  out of 5 stars 4 out of 5 stars  [facility] [phone] 2  out of 5 stars 3 out of 5 stars  [facility] [phone] 2  out of 5 stars 4 out of 5 stars  [facility] [phone] 3 out of 5 stars 5 out of [place] [phone] 3  out of 5 stars 5 out of [place] [phone] 3 out of 5 stars 4 out of [place] [phone] 4  out of 5 stars 5 out of 5 stars  [facility] [phone] 2 out of 5 stars Not [place] number Footnote as displayed on [place]  1 This agency provides services under a federal waiver program to non-traditional, chronic long term population.  2 This agency provides services to a special needs population.  3 Not Available.  4 The number of patient episodes for this measure is too small to report.  5 This measure currently does not have data or provider has been certified/recertified for less than 6 months.  6 The national average for this measure is not provided because of state-to-state differences in data collection.  7 Medicare is not displaying rates for this measure for any home health agency, because of an issue  with the data.  8 There were problems with the data and they are being corrected.  9 Zero, or very few, patients met the survey's rules for inclusion. The scores shown, if any, reflect a very small number of surveys and may not accurately tell how an agency is doing.  10 Survey results are based on less than 12 months of data.  11 Fewer than 70 patients completed the survey. Use the scores shown, if any, with caution as the number of surveys may be too low to accurately tell how an agency is doing.  12 No survey results are available for this period.  13 Data suppressed by CMS for one or more quarters.

## 2018-12-11 NOTE — Care Management Obs Status (Signed)
[place] NOTIFICATION   Patient Details  Name: Nikeria Serenity Batley MRN: [id number] Date of Birth: Apr 05, 1950   Medicare Observation Status Notification Given:  Yes    Joaquin Courts, RN 12/11/2018, 10:45 AM

## 2018-12-13 ENCOUNTER — Encounter (HOSPITAL_COMMUNITY): Payer: Self-pay | Admitting: Orthopaedic Surgery

## 2018-12-14 ENCOUNTER — Telehealth: Payer: Self-pay

## 2018-12-14 NOTE — Telephone Encounter (Signed)
PT with [facility] called requested verbal orders for  3x/wk for 2 wk 2x/wk after that  Verbal order given by me.

## 2018-12-23 ENCOUNTER — Ambulatory Visit (INDEPENDENT_AMBULATORY_CARE_PROVIDER_SITE_OTHER): Payer: Medicare Other | Admitting: Orthopaedic Surgery

## 2018-12-23 ENCOUNTER — Other Ambulatory Visit: Payer: Self-pay

## 2018-12-23 ENCOUNTER — Encounter: Payer: Self-pay | Admitting: Orthopaedic Surgery

## 2018-12-23 DIAGNOSIS — Z96642 Presence of left artificial hip joint: Secondary | ICD-10-CM

## 2018-12-23 NOTE — Progress Notes (Signed)
The patient is 2 weeks out from a left total hip arthroplasty.  She has had a right hip replaced before.  She states she is having some intense pain on occasion when she gets up and down.  She does get around her wheelchair more often.  She is taking an aspirin twice a day.  On exam her left hip incision looks good server the staples.  She does have a large seroma and I was able to aspirate over 120 cc of fluid from around the hip area.  This does not appear infected at all.  Incision looks good.  I gave her reassurance that this is just a large seroma.  She is someone who does weigh 268 pounds we see this often in the obese population.  Her calf is soft and her ligaments are equal.  I would like to see her back in a week just to see if we have to do any more seroma from her hip.  She will alternate ice and heat.  She is not taking any pain medication so she can drive from my standpoint.  She will go back to just 1 aspirin a day which she was taken preoperative.  All question concerns were answered and addressed.  No x-rays are needed at her visit next week.

## 2018-12-29 ENCOUNTER — Ambulatory Visit: Payer: Medicare Other | Admitting: Orthopaedic Surgery

## 2019-01-05 ENCOUNTER — Other Ambulatory Visit: Payer: Self-pay

## 2019-01-05 ENCOUNTER — Encounter: Payer: Self-pay | Admitting: Orthopaedic Surgery

## 2019-01-05 ENCOUNTER — Ambulatory Visit (INDEPENDENT_AMBULATORY_CARE_PROVIDER_SITE_OTHER): Payer: Medicare Other | Admitting: Orthopaedic Surgery

## 2019-01-05 DIAGNOSIS — Z96642 Presence of left artificial hip joint: Secondary | ICD-10-CM

## 2019-01-05 NOTE — Progress Notes (Signed)
The patient is only 3 weeks and 5 days status post a left total hip arthroplasty but I want to see her back this week since I saw her last week and drained about 120 cc of seroma fluid off of her hip.  She says she is doing well.  On exam alert to the incision.  It is closed and there is no redness.  Surprisingly there is no significant seroma to drain off her hip today at all.  She is mobilizing using a rolling walker and overall looks like she is making progress.  This point I do not be see her back for 4 weeks unless she is having issues.  All question concerns were answered and addressed.  At her next visit no x-rays are needed unless there are problems.

## 2019-02-03 ENCOUNTER — Encounter: Payer: Self-pay | Admitting: Orthopaedic Surgery

## 2019-02-03 ENCOUNTER — Other Ambulatory Visit: Payer: Self-pay

## 2019-02-03 ENCOUNTER — Ambulatory Visit (INDEPENDENT_AMBULATORY_CARE_PROVIDER_SITE_OTHER): Payer: Medicare Other | Admitting: Orthopaedic Surgery

## 2019-02-03 DIAGNOSIS — Z96641 Presence of right artificial hip joint: Secondary | ICD-10-CM

## 2019-02-03 DIAGNOSIS — Z96642 Presence of left artificial hip joint: Secondary | ICD-10-CM

## 2019-02-03 NOTE — Progress Notes (Signed)
The patient is now almost 8 weeks status post a left total hip arthroplasty.  We will replace her right hip.  She feels like she is done very well.  In light of her obesity she is now mobilizing so much better than she has been able to stop one of her blood pressure medications and on of her diabetic medications.  She is walking with only a slight limp.  Her leg lengths appear equal.  Her incision on both sides of healed nicely.  She has fluid range of motion of both hips.  At this point she is doing so well that we do not need to see her back for 6 months.  She understands if there is any issues we can see her before then.  At her next visit we would like a standing low AP pelvis and lateral of both hips.  All questions and concerns were answered and addressed.

## 2019-08-03 ENCOUNTER — Ambulatory Visit: Payer: Medicare Other | Admitting: Orthopaedic Surgery

## 2019-08-17 ENCOUNTER — Ambulatory Visit (INDEPENDENT_AMBULATORY_CARE_PROVIDER_SITE_OTHER): Payer: Medicare Other

## 2019-08-17 ENCOUNTER — Encounter: Payer: Self-pay | Admitting: Orthopaedic Surgery

## 2019-08-17 ENCOUNTER — Ambulatory Visit (INDEPENDENT_AMBULATORY_CARE_PROVIDER_SITE_OTHER): Payer: Medicare Other | Admitting: Orthopaedic Surgery

## 2019-08-17 ENCOUNTER — Other Ambulatory Visit: Payer: Self-pay

## 2019-08-17 DIAGNOSIS — Z96641 Presence of right artificial hip joint: Secondary | ICD-10-CM | POA: Diagnosis not present

## 2019-08-17 DIAGNOSIS — Z96642 Presence of left artificial hip joint: Secondary | ICD-10-CM

## 2019-08-17 NOTE — Progress Notes (Signed)
Office Visit Note   Patient: Brittany Nielsen           Date of Birth: January 05, 1951           MRN: [id number] Visit Date: 08/17/2019              Requested by: No referring provider defined for this encounter. PCP: System, Pcp Not In   Assessment & Plan: Visit Diagnoses:  1. Status post total replacement of right hip   2. Status post total replacement of left hip     Plan: She is doing well from her hip replacements.  At this point follow-up can be as needed.  All questions and concerns were answered and addressed.  If things worsen in any way she will let us know.  Follow-Up Instructions: Return if symptoms worsen or fail to improve.   Orders:  Orders Placed This Encounter  Procedures   XR HIPS BILAT W OR W/O PELVIS 2V   No orders of the defined types were placed in this encounter.     Procedures: No procedures performed   Clinical Data: No additional findings.   Subjective: Chief Complaint  Patient presents with   Left Hip - Follow-up   Right Hip - Follow-up  The patient is following up having had bilateral hip replacements.  Her right hip was replaced in February 2020 in the left hip in November 2020.  She says she has good range of motion of both hips and good strength.  She is not having to push up like she used to get out of a chair and is trying to stay active.  She is obese.  She denies any groin pain and says she is doing great.  She has had no other acute change in her medical status.  HPI  Review of Systems She currently denies any headache, chest pain, shortness of breath, fever, chills, nausea, vomiting  Objective: Vital Signs: There were no vitals taken for this visit.  Physical Exam She is alert and orient x3 and in no acute distress Ortho Exam Examination of both hips show the move smoothly and fluidly.  Her leg lengths are equal. Specialty Comments:  No specialty comments available.  Imaging: XR HIPS BILAT W OR W/O PELVIS  2V  Result Date: 08/17/2019 An AP pelvis lateral both hips shows well-seated total hip arthroplasties with no complicating features.  There is good bony ingrowth.    PMFS History: Patient Active Problem List   Diagnosis Date Noted   Status post total replacement of left hip 12/10/2018   Status post total hip replacement, left 12/10/2018   Status post total replacement of right hip 02/26/2018   Unilateral primary osteoarthritis, left hip 01/25/2018   Unilateral primary osteoarthritis, right hip 01/25/2018   Past Medical History:  Diagnosis Date   Arthritis    Diabetes mellitus without complication ([facility])    Elevated cholesterol    Family history of adverse reaction to anesthesia    MOTHER NAUSEA    Hx of bronchitis    I HAD IT BACK IN SEPTEMBER 2019 AND I USUALLY HAVE ONCE A YEAR    Hx of hypokalemia    Hypertension    Panic attacks    Sleep apnea    CPAP     History reviewed. No pertinent family history.  Past Surgical History:  Procedure Laterality Date   CHOLECYSTECTOMY     EYE SURGERY Bilateral 10/13/18, 10/26/18   JOINT REPLACEMENT Right 02/26/2018   TOTAL HIP  ARTHROPLASTY Right 02/26/2018   Procedure: RIGHT TOTAL HIP ARTHROPLASTY ANTERIOR APPROACH;  Surgeon: Kathryne Hitch, MD;  Location: [facility];  Service: Orthopedics;  Laterality: Right;   TOTAL HIP ARTHROPLASTY Left 12/10/2018   Procedure: LEFT TOTAL HIP ARTHROPLASTY ANTERIOR APPROACH;  Surgeon: Kathryne Hitch, MD;  Location: [facility];  Service: Orthopedics;  Laterality: Left;   TUBAL LIGATION     Social History   Occupational History   Not on file  Tobacco Use   Smoking status: Never Smoker   Smokeless tobacco: Never Used  Vaping Use   Vaping Use: Never used  Substance and Sexual Activity   Alcohol use: Never   Drug use: Never   Sexual activity: Not on file

## 2020-07-13 IMAGING — RF DG HIP (WITH PELVIS) OPERATIVE*L*
1 series · 5 of 5 positions shown · non-contrast
Comparison: 02/26/2010

CLINICAL DATA: Left total hip arthroplasty

EXAM:
OPERATIVE LEFT HIP (WITH PELVIS IF PERFORMED) AP VIEWS
TECHNIQUE: Fluoroscopic spot image(s) were submitted for interpretation
post-operatively.

[Series 1: run · 5 of 5 slices shown]
[im 1/5]
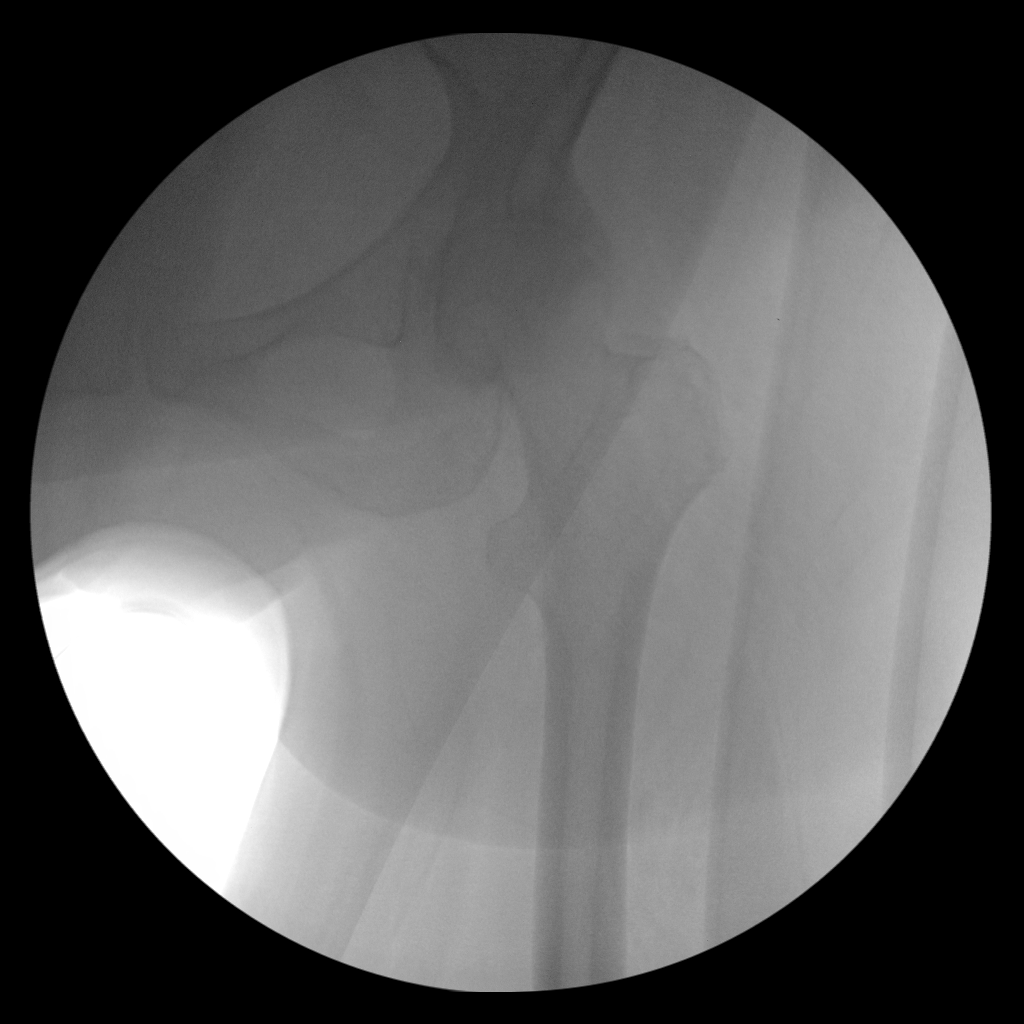
[im 2/5]
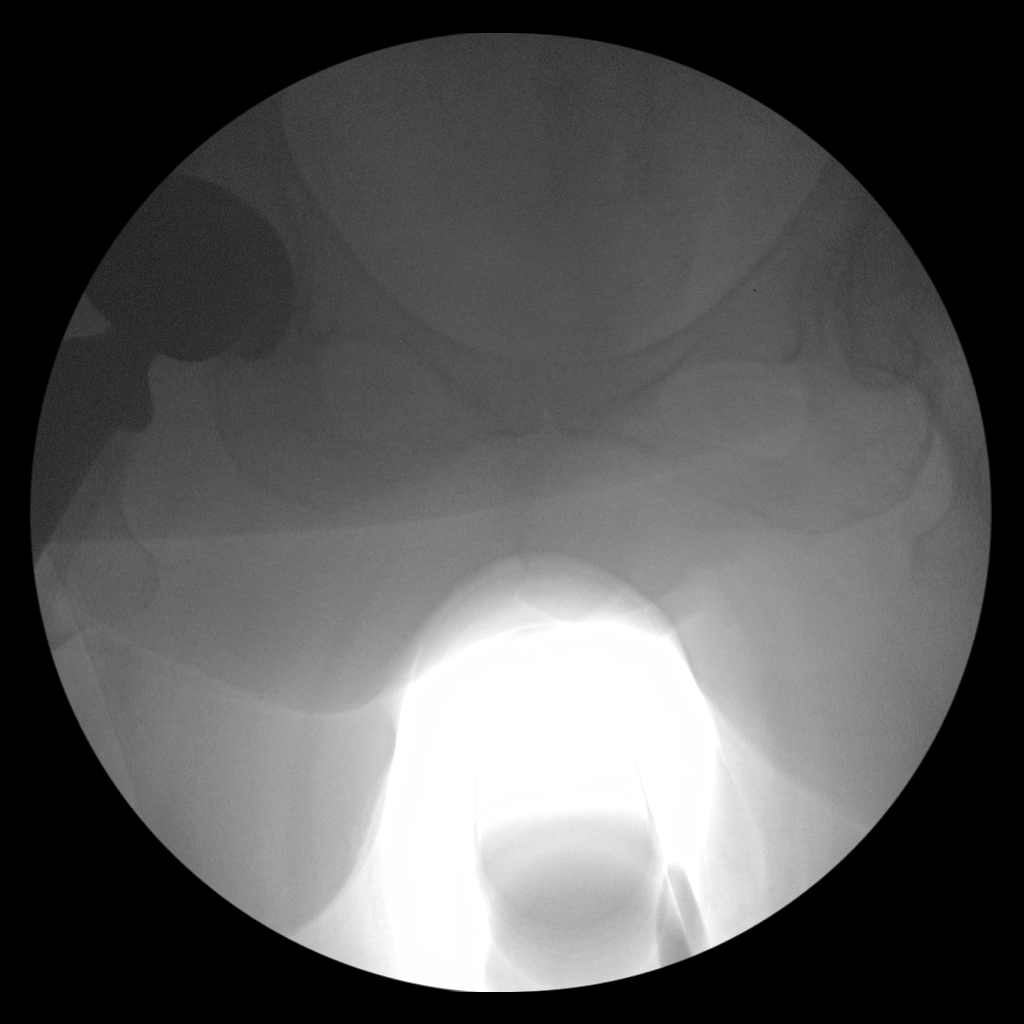
[im 3/5]
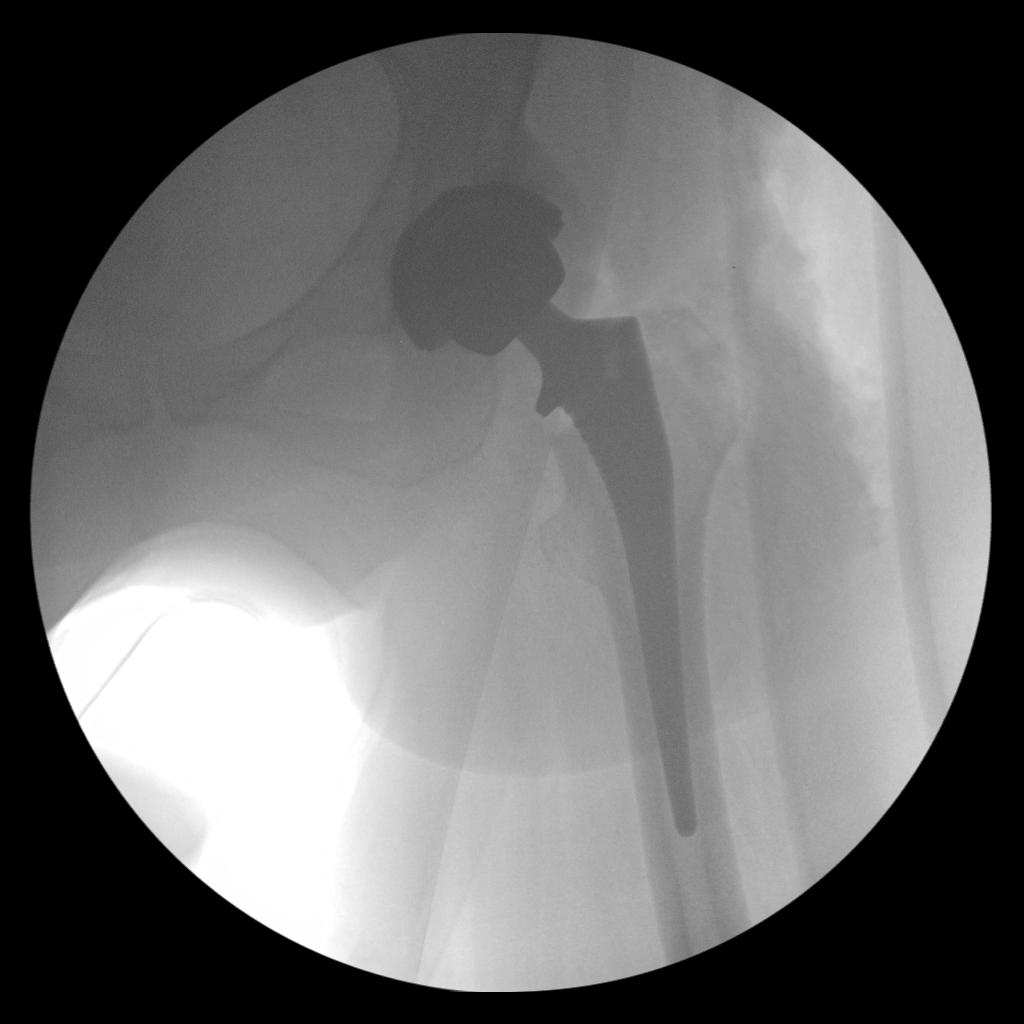
[im 4/5]
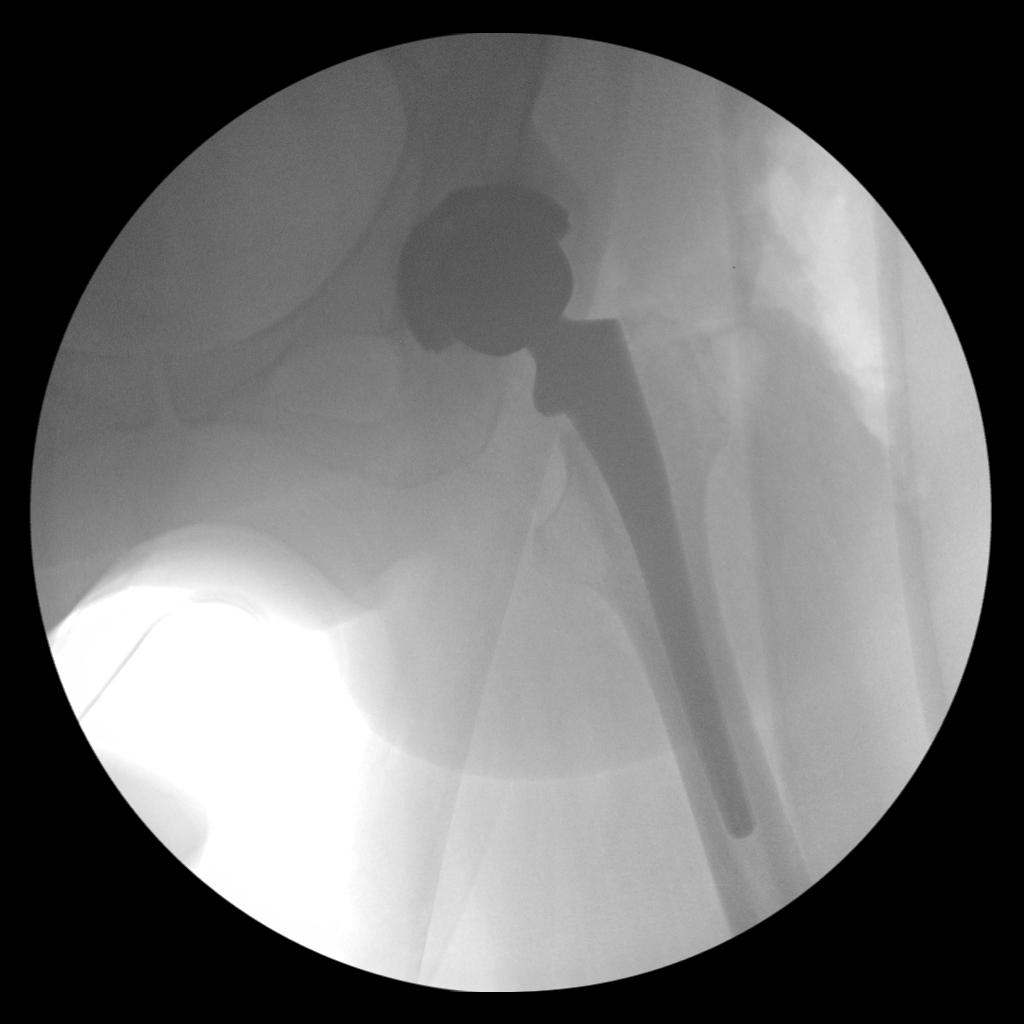
[im 5/5]
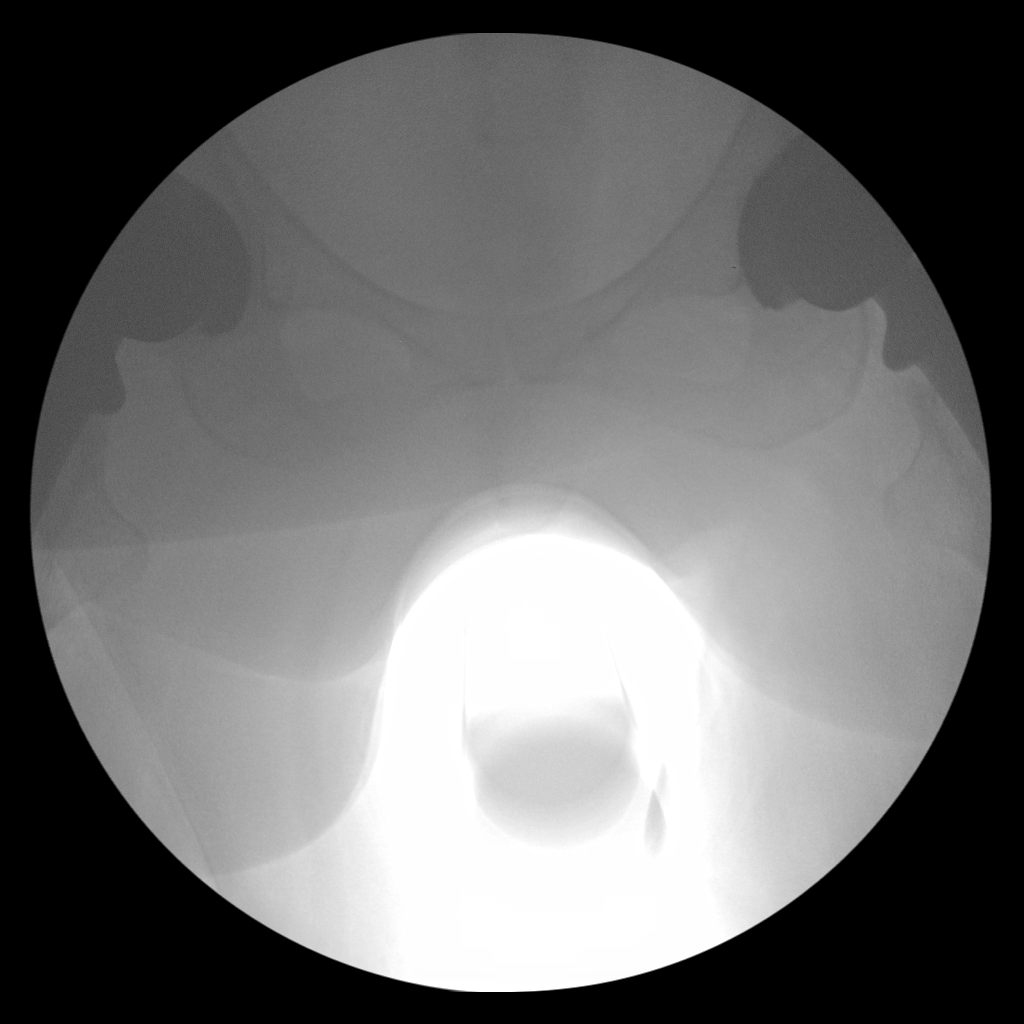

[5 of 5 positions shown; findings below may reference images not displayed]

FINDINGS: 5 C-arm fluoroscopic images were obtained intraoperatively and
submitted for post operative interpretation. Sequential images
demonstrate interval placement of left total hip arthroplasty
hardware. Components appear in their expected alignment. No obvious
intraoperative complication. Expected postoperative changes within
the soft tissues. 23 seconds of fluoroscopy time was utilized.
Please see the performing provider's procedural report for further
detail.
IMPRESSION: As above.

## 2020-07-13 IMAGING — DX DG PORTABLE PELVIS
1 series · 1 of 1 positions shown · non-contrast
Comparison: 02/26/2018

CLINICAL DATA: Left total hip arthroplasty

EXAM:
PORTABLE PELVIS 1-2 VIEWS

[pelvis ap]
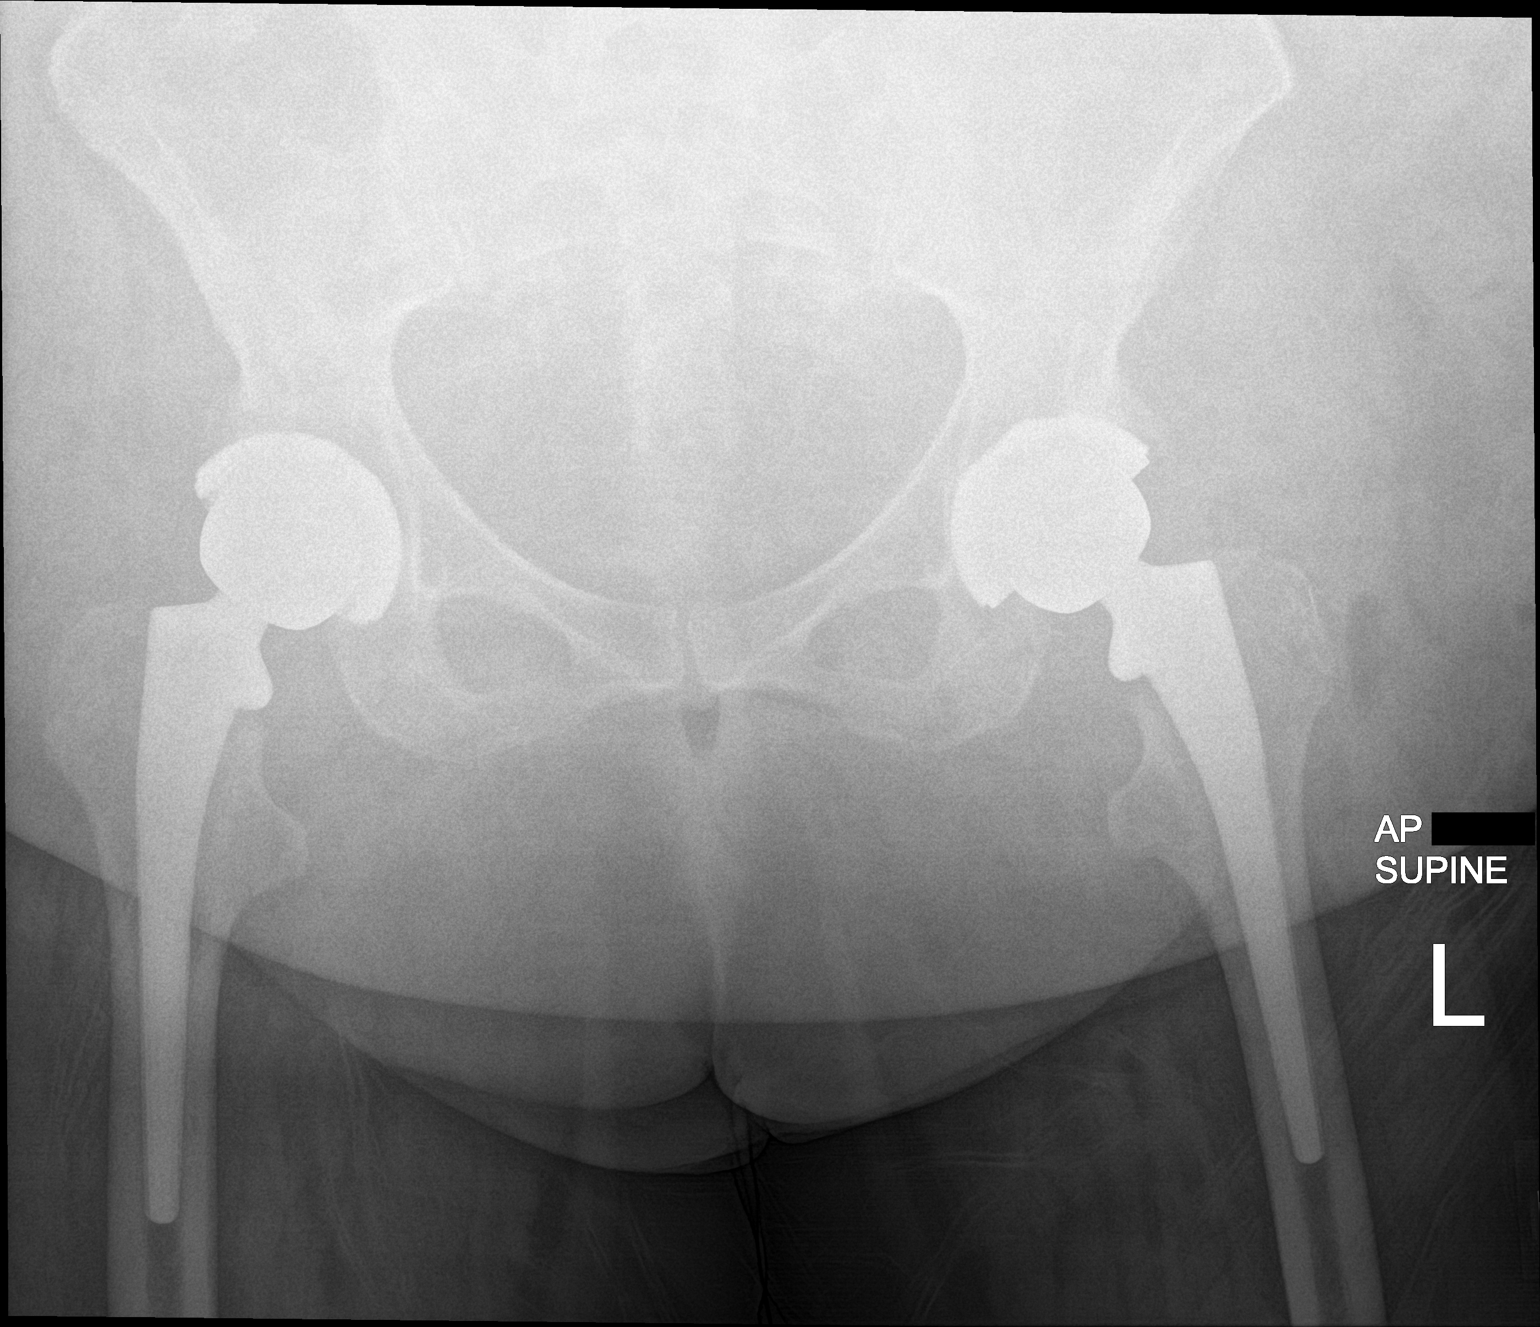

[1 of 1 positions shown; findings below may reference images not displayed]

FINDINGS: Interval postsurgical changes from left total hip arthroplasty.
Arthroplasty components appear in their expected alignment without
evidence of immediate postoperative complication. Expected
postoperative changes within the overlying soft tissues. Right hip
arthroplasty hardware appears intact.
IMPRESSION: Expected postoperative appearance following left total hip
arthroplasty. No evidence of immediate postoperative complication.
